# Patient Record
Sex: Male | Born: 1975 | Race: White | Hispanic: No | Marital: Married | Smoking: Never smoker
Health system: Southern US, Community
[De-identification: ages and names within clinical notes are randomized; demographics above are authoritative.]

## PROBLEM LIST (undated history)

## (undated) DIAGNOSIS — I10 Essential (primary) hypertension: Secondary | ICD-10-CM

---

## 1997-09-11 ENCOUNTER — Emergency Department (HOSPITAL_COMMUNITY): Admission: EM | Admit: 1997-09-11 | Discharge: 1997-09-11 | Payer: Self-pay | Admitting: *Deleted

## 1997-12-28 ENCOUNTER — Emergency Department (HOSPITAL_COMMUNITY): Admission: EM | Admit: 1997-12-28 | Discharge: 1997-12-28 | Payer: Self-pay | Admitting: Emergency Medicine

## 1999-02-22 ENCOUNTER — Emergency Department (HOSPITAL_COMMUNITY): Admission: EM | Admit: 1999-02-22 | Discharge: 1999-02-22 | Payer: Self-pay | Admitting: Emergency Medicine

## 1999-12-04 ENCOUNTER — Emergency Department (HOSPITAL_COMMUNITY): Admission: EM | Admit: 1999-12-04 | Discharge: 1999-12-04 | Payer: Self-pay | Admitting: Emergency Medicine

## 2000-06-13 ENCOUNTER — Emergency Department (HOSPITAL_COMMUNITY): Admission: EM | Admit: 2000-06-13 | Discharge: 2000-06-13 | Payer: Self-pay

## 2002-07-05 ENCOUNTER — Emergency Department (HOSPITAL_COMMUNITY): Admission: EM | Admit: 2002-07-05 | Discharge: 2002-07-05 | Payer: Self-pay

## 2002-07-05 ENCOUNTER — Encounter: Payer: Self-pay | Admitting: Emergency Medicine

## 2002-11-05 ENCOUNTER — Emergency Department (HOSPITAL_COMMUNITY): Admission: EM | Admit: 2002-11-05 | Discharge: 2002-11-06 | Payer: Self-pay | Admitting: Emergency Medicine

## 2002-11-06 ENCOUNTER — Encounter: Payer: Self-pay | Admitting: Emergency Medicine

## 2004-05-28 ENCOUNTER — Emergency Department (HOSPITAL_COMMUNITY): Admission: EM | Admit: 2004-05-28 | Discharge: 2004-05-28 | Payer: Self-pay | Admitting: Emergency Medicine

## 2005-03-08 ENCOUNTER — Emergency Department (HOSPITAL_COMMUNITY): Admission: EM | Admit: 2005-03-08 | Discharge: 2005-03-08 | Payer: Self-pay | Admitting: *Deleted

## 2007-02-28 ENCOUNTER — Inpatient Hospital Stay (HOSPITAL_COMMUNITY): Admission: EM | Admit: 2007-02-28 | Discharge: 2007-03-02 | Payer: Self-pay | Admitting: Emergency Medicine

## 2010-06-30 NOTE — H&P (Signed)
Joel Maldonado, Joel Maldonado                 ACCOUNT NO.:  [id number]   MEDICAL RECORD NO.:  [id number]          PATIENT TYPE:  INP   LOCATION:  5118                         FACILITY:  [facility]   PHYSICIAN:  Isidor Holts, M.D.  DATE OF BIRTH:  Nov 30, 1975   DATE OF ADMISSION:  02/28/2007  DATE OF DISCHARGE:                              HISTORY & PHYSICAL   PMD:  Unassigned.   CHIEF COMPLAINT:  Abdominal pain for 1 week on and off, not associated  with fever, vomiting or diarrhea.   HISTORY OF PRESENT ILLNESS:  This is a 35 year old male with above  symptoms, since 02/27/2007 in the evening.  Symptoms have become  increasingly worse, particularly during the course of the night.  The  patient, therefore came to the emergency department this a.m.   PAST MEDICAL HISTORY:  Hypertension, although not on any treatment.   MEDICATIONS:  Not on any regular medication.   ALLERGIES:  NO KNOWN DRUG ALLERGIES.   REVIEW OF SYSTEMS:  As per HPI and chief complaint, otherwise negative.   SOCIAL HISTORY:  The patient is married, works as a Naval architect.  Nonsmoker, drinks alcohol only occasionally, has no history of drug  abuse.  He has one daughter and is expecting yet another offspring.  Both parents still living.  The patient's mother is a breast cancer  survivor.  His father has traumatic paraplegia.  No family history of  heart disease.   PHYSICAL EXAMINATION:  VITALS:  Temperature 101.2, pulse 91 per minute  and regular, respiratory rate 88.  BP initially 160/94 mmHg, rechecked  126/79 mmHg.  Pulse oximeter 99% on room air.  GENERAL:  The patient does not appear in obvious acute distress at the  time of this evaluation, alert, communicative.  Not short of breath at  rest.  HEENT:  No clinical pallor or jaundice.  No conjunctival injection.  Throat is clear.  NECK:  Supple.  JVP not seen.  No palpable lymphadenopathy.  No palpable  goiter.  No carotid bruits.  CHEST:  Clinically clear to  auscultation.  No wheezes or crackles.  HEART:  Sounds 1 and 2 heard.  Normal and regular, no murmurs.  ABDOMEN:  Morbidly obese, soft mildly tender in both lower quadrants,  more on the left than on the right.  No guarding, no rebound.  Normal  bowel sounds.  LOWER EXTREMITY EXAMINATION:  No pitting edema.  Palpable peripheral  pulses.  MUSCULOSKELETAL SYSTEM:  Unremarkable.  CENTRAL NERVOUS SYSTEM:  No focal neurologic deficit on gross  examination.   INVESTIGATIONS:  CBC:  WBC 11, hemoglobin 16, hematocrit 46.5, platelets  218,000.  Electrolytes:  Sodium 135, potassium 3.7, chloride 101, CO2  24, BUN 8, creatinine 0.9, glucose 161, AST 31, ALT 63, alkaline  phosphatase 37.  Urinalysis is negative.  Abdominal CT scan dated  February 28, 2007 showed proximal sigmoid diverticulitis.  No free air,  abscess, or free fluid.  Fatty liver is noted.   ASSESSMENT AND PLAN:  1. Acute diverticulitis.  This clinically appears mild, however the      patient does  have a pyrexia of 101.2.  We shall admit him,      institute bowel rest, commence intravenous fluid hydration,      analgesics and intravenous Ciprofloxacin and Flagyl.  For      completeness, will do blood cultures and serum lipase levels.   1. Hypertension.  The patient has a known history of hypertension and      is not on any medication at the present time.  Although, he had a      bump in blood pressure at the time of presentation, this may be      likely secondary to pain, or possibly anxiety.  Right now he is      normotensive.  We shall however, observe closely and should blood      pressure become persistently elevated, we will address as      appropriate.   1. Morbid obesity.  We shall check TSH, lipid profile and HbA1c.   1. Fatty liver.  The patient would certainly benefit from weight loss.   Further management will depend on clinical course.      Isidor Holts, M.D.  Electronically Signed     CO/MEDQ  D:   02/28/2007  T:  02/28/2007  Job:  [id number]

## 2010-06-30 NOTE — Discharge Summary (Signed)
Joel Maldonado, Joel Maldonado                 ACCOUNT NO.:  [id number]   MEDICAL RECORD NO.:  [id number]          PATIENT TYPE:  INP   LOCATION:  5118                         FACILITY:  [facility]   PHYSICIAN:  Elliot Cousin, M.D.    DATE OF BIRTH:  12/30/1975   DATE OF ADMISSION:  02/28/2007  DATE OF DISCHARGE:  03/02/2007                               DISCHARGE SUMMARY   DISCHARGE DIAGNOSES:  1. Acute proximal sigmoid diverticulitis.  2. Fatty liver.  3. Obesity.  4. Hyperlipidemia.   DISCHARGE MEDICATIONS:  1. Cipro 500 mg b.i.d. for 7 more days.  2. Flagyl 500 mg t.i.d. for 7 more days.  3. Lipitor 20 mg half a tablet daily.   DISCHARGE DISPOSITION:  The patient was discharged home in improved and  stable condition on March 02, 2007.  He was advised to follow up with  the [facility] in 1 week.   PROCEDURE PERFORMED:  CT scan of the abdomen and pelvis with contrast on  February 28, 2007.  The results revealed proximal sigmoid diverticulitis  without abscess or gross free air.   CONSULTATIONS:  None.   HISTORY OF PRESENT ILLNESS:  The patient is a 35 year old man who  presented to the emergency department on February 28, 2007 with a chief  complaint of abdominal pain and nausea.  When he presented to the  emergency department, he was febrile with a temperature of 101.2.  A CT  scan of the abdomen and pelvis was ordered in the emergency department,  and the results were significant for proximal sigmoid diverticulitis.  The patient was therefore admitted for further evaluation and  management.   HOSPITAL COURSE:  1. ACUTE SIGMOID DIVERTICULITIS.  Blood cultures were ordered on      admission.  The patient was started on intravenous Flagyl and      ciprofloxacin.  He was started on a clear liquid diet.  Pain      management was started with as-needed Dilaudid and oxycodone.      Maintenance IV fluids were started with normal saline at 100 mL an      hour.  Over the course of the  hospitalization, the patient's pain      subsided and then completely resolved.  His blood cultures have      remained negative so far during the hospitalization.  His WBC,      which was 11.0 at the time of the initial evaluation, improved to      6.9 prior to hospital discharge.  The patient has been completely      afebrile for 24 hours.  He will be discharged home today on 7 more      days of Cipro and Flagyl.  2. HYPERLIPIDEMIA.  The patient's fasting lipid panel was assessed and      the results are as follows: Total      cholesterol 204, triglycerides 138, HDL 37, and LDL 39.  The      patient will be discharged to home on Lipitor 10 mg daily.  He was  advised to follow a low-fat diet and to lose weight.   DISCHARGE LABORATORY DATA:  TSH 4.3.  Hemoglobin A1c 6.0.  Blood  cultures negative so far.      Elliot Cousin, M.D.  Electronically Signed     DF/MEDQ  D:  03/02/2007  T:  03/02/2007  Job:  [id number]   cc:   Melvern Banker

## 2010-11-05 LAB — URINALYSIS, ROUTINE W REFLEX MICROSCOPIC
Bilirubin Urine: NEGATIVE
Hgb urine dipstick: NEGATIVE
Specific Gravity, Urine: 1.022
pH: 6

## 2010-11-05 LAB — LIPID PANEL
HDL: 37 — ABNORMAL LOW
LDL Cholesterol: 139 — ABNORMAL HIGH
Total CHOL/HDL Ratio: 5.5
Triglycerides: 138
VLDL: 28

## 2010-11-05 LAB — CULTURE, BLOOD (ROUTINE X 2)
Culture: NO GROWTH
Culture: NO GROWTH

## 2010-11-05 LAB — BASIC METABOLIC PANEL
Calcium: 9.4
GFR calc Af Amer: >60
GFR calc non Af Amer: >60
Potassium: 4.3
Sodium: 137

## 2010-11-05 LAB — DIFFERENTIAL
Basophils Absolute: 0
Basophils Relative: 0
Eosinophils Absolute: 0.1
Monocytes Relative: 9
Neutro Abs: 8.5 — ABNORMAL HIGH
Neutrophils Relative %: 77

## 2010-11-05 LAB — COMPREHENSIVE METABOLIC PANEL
ALT: 63 — ABNORMAL HIGH
Alkaline Phosphatase: 37 — ABNORMAL LOW
BUN: 8
CO2: 24
Chloride: 101
Glucose, Bld: 161 — ABNORMAL HIGH
Potassium: 3.7
Sodium: 135
Total Bilirubin: 1.1
Total Protein: 7.1

## 2010-11-05 LAB — I-STAT 8, (EC8 V) (CONVERTED LAB)
BUN: 9
Bicarbonate: 22.3
Chloride: 107
Operator id: 282201
pCO2, Ven: 29 — ABNORMAL LOW
pH, Ven: 7.494 — ABNORMAL HIGH

## 2010-11-05 LAB — CBC
HCT: 46.5
Hemoglobin: 14.3
Hemoglobin: 16
RBC: 4.64
RBC: 5.19
RDW: 12.9
WBC: 11 — ABNORMAL HIGH
WBC: 6.9

## 2010-11-05 LAB — POCT CARDIAC MARKERS
CKMB, poc: 1.5
Myoglobin, poc: 89.8

## 2010-11-05 LAB — POCT I-STAT CREATININE: Creatinine, Ser: 1.1

## 2010-11-22 ENCOUNTER — Emergency Department (HOSPITAL_COMMUNITY)
Admission: EM | Admit: 2010-11-22 | Discharge: 2010-11-23 | Disposition: A | Discharge status: Home / Self Care | Payer: Self-pay | Source: Home / Self Care | Attending: Emergency Medicine | Admitting: Emergency Medicine

## 2010-11-22 DIAGNOSIS — R109 Unspecified abdominal pain: Secondary | ICD-10-CM | POA: Insufficient documentation

## 2010-11-22 DIAGNOSIS — R197 Diarrhea, unspecified: Secondary | ICD-10-CM | POA: Insufficient documentation

## 2010-11-22 DIAGNOSIS — R112 Nausea with vomiting, unspecified: Secondary | ICD-10-CM | POA: Insufficient documentation

## 2010-11-22 DIAGNOSIS — I10 Essential (primary) hypertension: Secondary | ICD-10-CM | POA: Insufficient documentation

## 2010-11-23 ENCOUNTER — Emergency Department (HOSPITAL_COMMUNITY)
Admit: 2010-11-23 | Discharge: 2010-11-23 | Disposition: A | Discharge status: Home / Self Care | Payer: Self-pay | Attending: Emergency Medicine | Admitting: Emergency Medicine

## 2010-11-23 LAB — HEPATIC FUNCTION PANEL
Bilirubin, Direct: 0.1 mg/dL (ref 0.0–0.3)
Total Bilirubin: 1 mg/dL (ref 0.3–1.2)

## 2010-11-23 LAB — URINALYSIS, ROUTINE W REFLEX MICROSCOPIC
Bilirubin Urine: NEGATIVE
Ketones, ur: NEGATIVE mg/dL
Nitrite: NEGATIVE
Protein, ur: NEGATIVE mg/dL
pH: 5.5 (ref 5.0–8.0)

## 2010-11-23 LAB — CBC
HCT: 46.9 % (ref 39.0–52.0)
MCHC: 36.2 g/dL — ABNORMAL HIGH (ref 30.0–36.0)
MCV: 87.7 fL (ref 78.0–100.0)
RDW: 12.6 % (ref 11.5–15.5)

## 2010-11-23 LAB — DIFFERENTIAL
Basophils Absolute: 0 10*3/uL (ref 0.0–0.1)
Eosinophils Relative: 2 % (ref 0–5)
Lymphocytes Relative: 21 % (ref 12–46)
Monocytes Absolute: 0.8 10*3/uL (ref 0.1–1.0)

## 2010-11-23 LAB — POCT I-STAT, CHEM 8
Creatinine, Ser: 1.1 mg/dL (ref 0.50–1.35)
Glucose, Bld: 127 mg/dL — ABNORMAL HIGH (ref 70–99)
Hemoglobin: 17.3 g/dL — ABNORMAL HIGH (ref 13.0–17.0)
TCO2: 25 mmol/L (ref 0–100)

## 2010-11-23 LAB — LIPASE, BLOOD: Lipase: 33 U/L (ref 11–59)

## 2010-11-23 MED ORDER — IOHEXOL 300 MG/ML  SOLN
100.0000 mL | Freq: Once | INTRAMUSCULAR | Status: AC | PRN
  Administered 2010-11-23: 100 mL via INTRAVENOUS

## 2012-08-27 ENCOUNTER — Emergency Department (HOSPITAL_BASED_OUTPATIENT_CLINIC_OR_DEPARTMENT_OTHER): Payer: Self-pay

## 2012-08-27 ENCOUNTER — Emergency Department (HOSPITAL_BASED_OUTPATIENT_CLINIC_OR_DEPARTMENT_OTHER)
Admission: EM | Admit: 2012-08-27 | Discharge: 2012-08-27 | Disposition: A | Discharge status: Home / Self Care | Payer: Self-pay | Attending: Emergency Medicine | Admitting: Emergency Medicine

## 2012-08-27 ENCOUNTER — Encounter (HOSPITAL_BASED_OUTPATIENT_CLINIC_OR_DEPARTMENT_OTHER): Payer: Self-pay | Admitting: Emergency Medicine

## 2012-08-27 DIAGNOSIS — K5732 Diverticulitis of large intestine without perforation or abscess without bleeding: Secondary | ICD-10-CM | POA: Insufficient documentation

## 2012-08-27 DIAGNOSIS — K5792 Diverticulitis of intestine, part unspecified, without perforation or abscess without bleeding: Secondary | ICD-10-CM

## 2012-08-27 DIAGNOSIS — R739 Hyperglycemia, unspecified: Secondary | ICD-10-CM

## 2012-08-27 DIAGNOSIS — R509 Fever, unspecified: Secondary | ICD-10-CM | POA: Insufficient documentation

## 2012-08-27 DIAGNOSIS — R7309 Other abnormal glucose: Secondary | ICD-10-CM | POA: Insufficient documentation

## 2012-08-27 LAB — COMPREHENSIVE METABOLIC PANEL
ALT: 63 U/L — ABNORMAL HIGH (ref 0–53)
AST: 26 U/L (ref 0–37)
Albumin: 3.7 g/dL (ref 3.5–5.2)
CO2: 27 mEq/L (ref 19–32)
Calcium: 10.1 mg/dL (ref 8.4–10.5)
Chloride: 101 mEq/L (ref 96–112)
GFR calc non Af Amer: >90 mL/min (ref >90)
Sodium: 139 mEq/L (ref 135–145)

## 2012-08-27 LAB — CG4 I-STAT (LACTIC ACID): Lactic Acid, Venous: 1.08 mmol/L (ref 0.5–2.2)

## 2012-08-27 LAB — URINALYSIS, ROUTINE W REFLEX MICROSCOPIC
Bilirubin Urine: NEGATIVE
Glucose, UA: >1000 mg/dL — AB
Hgb urine dipstick: NEGATIVE
Leukocytes, UA: NEGATIVE
Nitrite: NEGATIVE
Specific Gravity, Urine: 1.027 (ref 1.005–1.030)

## 2012-08-27 LAB — POCT I-STAT 3, VENOUS BLOOD GAS (G3P V)
Acid-Base Excess: 4 mmol/L — ABNORMAL HIGH (ref 0.0–2.0)
Bicarbonate: 27.7 mEq/L — ABNORMAL HIGH (ref 20.0–24.0)
Patient temperature: 98.8
pH, Ven: 7.47 — ABNORMAL HIGH (ref 7.250–7.300)

## 2012-08-27 LAB — CBC WITH DIFFERENTIAL/PLATELET
Basophils Absolute: 0 10*3/uL (ref 0.0–0.1)
Eosinophils Relative: 1 % (ref 0–5)
Hemoglobin: 15.4 g/dL (ref 13.0–17.0)
Lymphocytes Relative: 19 % (ref 12–46)
Neutro Abs: 6.1 10*3/uL (ref 1.7–7.7)
Platelets: 178 10*3/uL (ref 150–400)
RDW: 12.8 % (ref 11.5–15.5)
WBC: 9 10*3/uL (ref 4.0–10.5)

## 2012-08-27 LAB — GLUCOSE, CAPILLARY: Glucose-Capillary: 146 mg/dL — ABNORMAL HIGH (ref 70–99)

## 2012-08-27 LAB — KETONES, QUALITATIVE: Acetone, Bld: NEGATIVE

## 2012-08-27 LAB — URINE MICROSCOPIC-ADD ON

## 2012-08-27 MED ORDER — CIPROFLOXACIN IN D5W 400 MG/200ML IV SOLN
400.0000 mg | Freq: Once | INTRAVENOUS | Status: AC
  Administered 2012-08-27: 400 mg via INTRAVENOUS
  Filled 2012-08-27: qty 200

## 2012-08-27 MED ORDER — METRONIDAZOLE 500 MG PO TABS: 500.0000 mg | ORAL_TABLET | Freq: Two times a day (BID) | ORAL | Status: DC

## 2012-08-27 MED ORDER — MORPHINE SULFATE 4 MG/ML IJ SOLN
4.0000 mg | Freq: Once | INTRAMUSCULAR | Status: AC
  Administered 2012-08-27: 4 mg via INTRAVENOUS
  Filled 2012-08-27 (×2): qty 1

## 2012-08-27 MED ORDER — IOHEXOL 300 MG/ML  SOLN
50.0000 mL | Freq: Once | INTRAMUSCULAR | Status: AC | PRN
  Administered 2012-08-27: 50 mL via ORAL

## 2012-08-27 MED ORDER — CIPROFLOXACIN HCL 500 MG PO TABS: 500.0000 mg | ORAL_TABLET | Freq: Two times a day (BID) | ORAL | Status: DC

## 2012-08-27 MED ORDER — ONDANSETRON HCL 4 MG PO TABS: 4.0000 mg | ORAL_TABLET | Freq: Four times a day (QID) | ORAL | Status: DC

## 2012-08-27 MED ORDER — IOHEXOL 300 MG/ML  SOLN
100.0000 mL | Freq: Once | INTRAMUSCULAR | Status: AC | PRN
  Administered 2012-08-27: 100 mL via INTRAVENOUS

## 2012-08-27 MED ORDER — ONDANSETRON HCL 4 MG/2ML IJ SOLN
4.0000 mg | Freq: Once | INTRAMUSCULAR | Status: AC
  Administered 2012-08-27: 4 mg via INTRAVENOUS
  Filled 2012-08-27 (×2): qty 2

## 2012-08-27 MED ORDER — METRONIDAZOLE IN NACL 5-0.79 MG/ML-% IV SOLN
500.0000 mg | Freq: Once | INTRAVENOUS | Status: AC
  Administered 2012-08-27: 500 mg via INTRAVENOUS
  Filled 2012-08-27: qty 100

## 2012-08-27 MED ORDER — SODIUM CHLORIDE 0.9 % IV BOLUS (SEPSIS)
1000.0000 mL | Freq: Once | INTRAVENOUS | Status: AC
  Administered 2012-08-27: 1000 mL via INTRAVENOUS

## 2012-08-27 MED ORDER — HYDROCODONE-ACETAMINOPHEN 5-325 MG PO TABS: 2.0000 | ORAL_TABLET | ORAL | Status: DC | PRN

## 2012-08-27 NOTE — ED Provider Notes (Signed)
History  This chart was scribed for Joel Octave, MD, by Candelaria Stagers, ED Scribe. This patient was seen in room MH09/MH09 and the patient's care was started at 3:13 PM  CSN: [id number] Arrival date & time 08/27/12  1404  First MD Initiated Contact with Patient 08/27/12 1458     Chief Complaint  Patient presents with  . Abdominal Pain    The history is provided by the patient. No language interpreter was used.   HPI Comments: Joel Maldonado is a 37 y.o. male who presents to the Emergency Department complaining of waxing and waning lower abdominal pain that started two days ago.  He reports the pain last for several seconds each time occuring several times throughout the day.  Pt reports he has experienced similar pain about 5 years ago associated with h/o diverticulitis.  He was hospitalized during this episode.  Pt denies diarrhea, vomiting, dysuria, back pain, testicular pain, hematuria, or blood in stool.  Pt reports fever of 99.5 last night.  Nothing seems to make the sx better or worse.      No PCP  History reviewed. No pertinent past medical history. History reviewed. No pertinent past surgical history. No family history on file. History  Substance Use Topics  . Smoking status: Never Smoker   . Smokeless tobacco: Current User  . Alcohol Use: Yes     Comment: occasionally    Review of Systems  Constitutional: Positive for fever.  Gastrointestinal: Positive for abdominal pain (lower abdominal pain).  All other systems reviewed and are negative.    Allergies  Review of patient's allergies indicates no known allergies.  Home Medications  No current outpatient prescriptions on file. BP 168/117  Pulse 96  Temp(Src) 98.8 F (37.1 C) (Oral)  Resp 18  Ht 5\' 8"  (1.727 m)  Wt 285 lb (129.275 kg)  BMI 43.34 kg/m2  SpO2 100% Physical Exam  Nursing note and vitals reviewed. Constitutional: He is oriented to person, place, and time. He appears well-developed and  well-nourished. No distress.  HENT:  Head: Normocephalic and atraumatic.  Eyes: EOM are normal.  Neck: Neck supple. No tracheal deviation present.  Cardiovascular: Normal rate.   Pulmonary/Chest: Effort normal. No respiratory distress.  Abdominal: There is tenderness (mild lower abdominal tenderness). There is no rebound and no guarding.  Genitourinary:  No testicular tenderness.   Musculoskeletal: Normal range of motion.  No CVA tenderness.   Neurological: He is alert and oriented to person, place, and time.  Skin: Skin is warm and dry.  Psychiatric: He has a normal mood and affect. His behavior is normal.    ED Course  Procedures   DIAGNOSTIC STUDIES: Oxygen Saturation is 100% on room air, normal by my interpretation.    COORDINATION OF CARE:  3:19 PM Discussed course of care with pt which includes fluids, zofran, and CT abdomen.  Pt denies pain medication.  Pt understands and agrees.   5:35 PM Discussed images and lab results with pt.  Advised pt to follow up with PCP for elevated blood sugar.  Will prescribe antibiotics.  Pt understands and agrees.   Labs Reviewed  CBC WITH DIFFERENTIAL - Abnormal; Notable for the following:    Monocytes Absolute 1.1 (*)    All other components within normal limits  COMPREHENSIVE METABOLIC PANEL - Abnormal; Notable for the following:    Glucose, Bld 148 (*)    ALT 63 (*)    Total Bilirubin 1.3 (*)    All other components  within normal limits  URINALYSIS, ROUTINE W REFLEX MICROSCOPIC - Abnormal; Notable for the following:    Glucose, UA >1000 (*)    All other components within normal limits  GLUCOSE, CAPILLARY - Abnormal; Notable for the following:    Glucose-Capillary 146 (*)    All other components within normal limits  POCT I-STAT 3, BLOOD GAS (G3P V) - Abnormal; Notable for the following:    pH, Ven 7.470 (*)    pCO2, Ven 38.1 (*)    pO2, Ven 46.0 (*)    Bicarbonate 27.7 (*)    Acid-Base Excess 4.0 (*)    All other components  within normal limits  URINE MICROSCOPIC-ADD ON  LIPASE, BLOOD  KETONES, QUALITATIVE  CG4 I-STAT (LACTIC ACID)   Ct Abdomen Pelvis W Contrast  08/27/2012   *RADIOLOGY REPORT*  Clinical Data: Mid and lower abdominal pain for 3 days.  CT ABDOMEN AND PELVIS WITH CONTRAST  Technique:  Multidetector CT imaging of the abdomen and pelvis was performed following the standard protocol during bolus administration of intravenous contrast.  Contrast: 50mL OMNIPAQUE IOHEXOL 300 MG/ML  SOLN, OMNIPAQUE IOHEXOL 300 MG/ML  SOLN  Comparison: CT abdomen and pelvis 11/23/2010.  Findings: Lung bases are clear.  There is no pleural or pericardial effusion.  The liver is diffusely low attenuating consistent with fatty infiltration.  No focal liver lesion.  The gallbladder, biliary tree, adrenal glands, spleen, pancreas and right kidney appear normal.  There is a punctate nonobstructing stone in the mid pole of the left kidney.  The patient has sigmoid diverticular disease and there is wall thickening of the proximal sigmoid colon with surrounding inflammatory change consistent with superimposed acute diverticulitis.  No abscess or perforation is identified.  The colon is otherwise unremarkable.  The stomach and small bowel appear normal.  No lymphadenopathy or fluid is seen.  No focal bony abnormality is identified.  IMPRESSION:  1.  Study is positive for sigmoid diverticulosis with superimposed acute diverticulitis.  Negative for abscess or perforation. 2.  Fatty infiltration of the liver. 3.  Punctate nonobstructing stone upper pole left kidney.   Original Report Authenticated By: Holley Dexter, M.D.   No diagnosis found.  MDM  Lower abdominal pain similar to previous diverticulitis.  No fever, vomiting, diarrhea.  Glucose in urine with serum glucose of 148. No evidence of DKA.  Uncomplicated diverticulitis on CT scan. Patient's pain is under control. Tolerating by mouth. He is stable for outpatient antibiotics.  He is informed to followup with primary care physician regarding elevated blood sugar.  I personally performed the services described in this documentation, which was scribed in my presence. The recorded information has been reviewed and is accurate.    Joel Octave, MD 08/27/12 [phone]

## 2012-08-27 NOTE — ED Notes (Signed)
Pt discharged home with spouse.  Pt verablizes understanding of instructions.

## 2012-08-27 NOTE — ED Notes (Signed)
Pt. C/o lower middle abd pain since Fri intermittently; hx of diverticulitis and pain is similar;  Denies NVD

## 2013-04-23 ENCOUNTER — Encounter (HOSPITAL_COMMUNITY): Payer: Self-pay | Admitting: Emergency Medicine

## 2013-04-23 ENCOUNTER — Emergency Department (HOSPITAL_COMMUNITY)
Admission: EM | Admit: 2013-04-23 | Discharge: 2013-04-24 | Disposition: A | Discharge status: Home / Self Care | Payer: Self-pay | Attending: Emergency Medicine | Admitting: Emergency Medicine

## 2013-04-23 ENCOUNTER — Emergency Department (HOSPITAL_COMMUNITY): Payer: Self-pay

## 2013-04-23 DIAGNOSIS — S93601A Unspecified sprain of right foot, initial encounter: Secondary | ICD-10-CM

## 2013-04-23 DIAGNOSIS — Y9367 Activity, basketball: Secondary | ICD-10-CM | POA: Insufficient documentation

## 2013-04-23 DIAGNOSIS — X500XXA Overexertion from strenuous movement or load, initial encounter: Secondary | ICD-10-CM | POA: Insufficient documentation

## 2013-04-23 DIAGNOSIS — Y9239 Other specified sports and athletic area as the place of occurrence of the external cause: Secondary | ICD-10-CM | POA: Insufficient documentation

## 2013-04-23 DIAGNOSIS — Y92838 Other recreation area as the place of occurrence of the external cause: Secondary | ICD-10-CM

## 2013-04-23 DIAGNOSIS — S93409A Sprain of unspecified ligament of unspecified ankle, initial encounter: Secondary | ICD-10-CM | POA: Insufficient documentation

## 2013-04-23 NOTE — ED Notes (Signed)
Patient states playing basketball and when up to get the ball and his foot landed on another players foot rolling his right foot over

## 2013-04-23 NOTE — ED Provider Notes (Signed)
CSN: [id number]     Arrival date & time 04/23/13  2224 History   None    This chart was scribed for non-physician practitioner, Antony MaduraKelly Laporche Martelle, PA-C working with Loren Raceravid Yelverton, MD by Arlan OrganAshley Leger, ED Scribe. This patient was seen in room TR09C/TR09C and the patient's care was started at 12:00 AM.   Chief Complaint  Patient presents with  . Foot Injury   The history is provided by the patient. No language interpreter was used.    HPI Comments: Joel Maldonado is a 38 y.o. male who presents to the Emergency Department complaining of gradually worsening, constant pain to the right ankle that occurred a couple hours prior to arrival. Pt states he was playing basketball, and twisted his ankle resulting in injury. Pt also reports associated swelling to the ankle, and describes his current pain as throbbing. He has not tried anything OTC or any home remedies for his discomfort. Denies any recent or previous injury to the ankle. At this time he denies any fever, weakness, or numbness. Pt has no pertinent medical history, and no other concerns this visit.   History reviewed. No pertinent past medical history. History reviewed. No pertinent past surgical history. History reviewed. No pertinent family history. History  Substance Use Topics  . Smoking status: Never Smoker   . Smokeless tobacco: Current User  . Alcohol Use: Yes     Comment: occasionally    Review of Systems  Constitutional: Negative for fever and chills.  Musculoskeletal: Positive for arthralgias.  Skin: Negative for rash.  Neurological: Negative for weakness and numbness.     Allergies  Review of patient's allergies indicates no known allergies.  Home Medications   Current Outpatient Rx  Name  Route  Sig  Dispense  Refill  . ibuprofen (ADVIL,MOTRIN) 600 MG tablet   Oral   Take 1 tablet (600 mg total) by mouth every 6 (six) hours as needed.   30 tablet   0   . traMADol (ULTRAM) 50 MG tablet   Oral   Take 1 tablet (50  mg total) by mouth every 6 (six) hours as needed.   15 tablet   0    Triage Vitals: BP 166/103  Pulse 97  Temp(Src) 98.7 F (37.1 C) (Oral)  Resp 18  Ht 5\' 8"  (1.727 m)  Wt 287 lb (130.182 kg)  BMI 43.65 kg/m2  SpO2 96%   Physical Exam  Nursing note and vitals reviewed. Constitutional: He is oriented to person, place, and time. He appears well-developed and well-nourished. No distress.  HENT:  Head: Normocephalic and atraumatic.  Eyes: Conjunctivae and EOM are normal. No scleral icterus.  Neck: Normal range of motion.  Cardiovascular: Normal rate, regular rhythm and intact distal pulses.   DP and PT pulses 2+ b/l. Capillary refill normal in all digits of R foot.  Pulmonary/Chest: Effort normal. No respiratory distress.  Musculoskeletal: Normal range of motion.       Right ankle: Normal.       Right foot: He exhibits tenderness and swelling (mild). He exhibits normal range of motion, no bony tenderness, normal capillary refill, no crepitus, no deformity and no laceration.       Feet:  Neurological: He is alert and oriented to person, place, and time. He has normal strength and normal reflexes. He displays normal reflexes. No sensory deficit. GCS eye subscore is 4. GCS verbal subscore is 5. GCS motor subscore is 6.  Reflex Scores:      Patellar reflexes  are 2+ on the right side and 2+ on the left side.      Achilles reflexes are 2+ on the right side and 2+ on the left side. Skin: Skin is warm and dry. No rash noted. He is not diaphoretic. No erythema. No pallor.  Psychiatric: He has a normal mood and affect. His behavior is normal.    ED Course  Procedures (including critical care time)  DIAGNOSTIC STUDIES: Oxygen Saturation is 96% on RA, adequate by my interpretation.    COORDINATION OF CARE: 12:03 AM- Will order X-Ray. Discussed treatment plan with pt at bedside and pt agreed to plan.     Labs Review Labs Reviewed - No data to display Imaging Review Dg Foot Complete  Right  04/23/2013   CLINICAL DATA:  Foot injury while playing basketball.  EXAM: RIGHT FOOT COMPLETE - 3+ VIEW  COMPARISON:  None available for comparison at time of study interpretation.  FINDINGS: There is no evidence of fracture or dislocation. There is no evidence of arthropathy or other focal bone abnormality. Small calcaneal spur plantar fascia insertion. Tiny corticated bony fragment at medial malleolus tip suggests remote avulsion injury though, recommend correlation with point tenderness. Mild midfoot osteoarthrosis. Soft tissues are unremarkable.  IMPRESSION: No acute foot fracture deformity or dislocation.   Electronically Signed   By: Awilda Metro   On: 04/23/2013 23:51     EKG Interpretation None      MDM   Final diagnoses:  Sprain of foot, right    Uncomplicated right foot sprain. Patient well and nontoxic appearing, hemodynamically stable, and afebrile. He is neurovascularly intact on physical exam. No gross sensory deficits appreciated. Reflexes normal and symmetric in bilateral lower extremities. X-ray today is negative for fracture, dislocation, or bony deformity. Symptoms consistent with sprain of dorsolateral aspect of right foot. ASO ankle applied in ED for stability. RICE advised as well as ibuprofen for pain control. Return precautions discussed and patient agreeable to plan with no unaddressed concerns.  I personally performed the services described in this documentation, which was scribed in my presence. The recorded information has been reviewed and is accurate.    Antony Madura, PA-C 04/24/13 [phone]

## 2013-04-24 MED ORDER — IBUPROFEN 600 MG PO TABS: 600.0000 mg | ORAL_TABLET | Freq: Four times a day (QID) | ORAL | Status: DC | PRN

## 2013-04-24 MED ORDER — TRAMADOL HCL 50 MG PO TABS: 50.0000 mg | ORAL_TABLET | Freq: Four times a day (QID) | ORAL | Status: DC | PRN

## 2013-04-24 NOTE — ED Provider Notes (Signed)
Medical screening examination/treatment/procedure(s) were performed by non-physician practitioner and as supervising physician I was immediately available for consultation/collaboration.   EKG Interpretation None        Jamesia Linnen, MD 04/24/13 2321 

## 2013-04-24 NOTE — Discharge Instructions (Signed)
Recommend rest, ice the affected area 3-4 times per day for at least 30 minutes each time, compression with an ankle brace or Ace wrap, and elevation. Take ibuprofen 600 mg every 6 hours for pain control. You may take tramadol as prescribed for breakthrough pain. Do not drive or drink alcohol after taking this medication as it may make you drowsy or impaired judgment. Return if symptoms worsen.  Foot Sprain The muscles and cord like structures which attach muscle to bone (tendons) that surround the feet are made up of units. A foot sprain can occur at the weakest spot in any of these units. This condition is most often caused by injury to or overuse of the foot, as from playing contact sports, or aggravating a previous injury, or from poor conditioning, or obesity. SYMPTOMS  Pain with movement of the foot.  Tenderness and swelling at the injury site.  Loss of strength is present in moderate or severe sprains. THE THREE GRADES OR SEVERITY OF FOOT SPRAIN ARE:  Mild (Grade I): Slightly pulled muscle without tearing of muscle or tendon fibers or loss of strength.  Moderate (Grade II): Tearing of fibers in a muscle, tendon, or at the attachment to bone, with small decrease in strength.  Severe (Grade III): Rupture of the muscle-tendon-bone attachment, with separation of fibers. Severe sprain requires surgical repair. Often repeating (chronic) sprains are caused by overuse. Sudden (acute) sprains are caused by direct injury or over-use. DIAGNOSIS  Diagnosis of this condition is usually by your own observation. If problems continue, a caregiver may be required for further evaluation and treatment. X-rays may be required to make sure there are not breaks in the bones (fractures) present. Continued problems may require physical therapy for treatment. PREVENTION  Use strength and conditioning exercises appropriate for your sport.  Warm up properly prior to working out.  Use athletic shoes that are  made for the sport you are participating in.  Allow adequate time for healing. Early return to activities makes repeat injury more likely, and can lead to an unstable arthritic foot that can result in prolonged disability. Mild sprains generally heal in 3 to 10 days, with moderate and severe sprains taking 2 to 10 weeks. Your caregiver can help you determine the proper time required for healing. HOME CARE INSTRUCTIONS   Apply ice to the injury for 15-20 minutes, 03-04 times per day. Put the ice in a plastic bag and place a towel between the bag of ice and your skin.  An elastic wrap (like an Ace bandage) may be used to keep swelling down.  Keep foot above the level of the heart, or at least raised on a footstool, when swelling and pain are present.  Try to avoid use other than gentle range of motion while the foot is painful. Do not resume use until instructed by your caregiver. Then begin use gradually, not increasing use to the point of pain. If pain does develop, decrease use and continue the above measures, gradually increasing activities that do not cause discomfort, until you gradually achieve normal use.  Use crutches if and as instructed, and for the length of time instructed.  Keep injured foot and ankle wrapped between treatments.  Massage foot and ankle for comfort and to keep swelling down. Massage from the toes up towards the knee.  Only take over-the-counter or prescription medicines for pain, discomfort, or fever as directed by your caregiver. SEEK IMMEDIATE MEDICAL CARE IF:   Your pain and swelling increase, or  pain is not controlled with medications.  You have loss of feeling in your foot or your foot turns cold or blue.  You develop new, unexplained symptoms, or an increase of the symptoms that brought you to your caregiver. MAKE SURE YOU:   Understand these instructions.  Will watch your condition.  Will get help right away if you are not doing well or get  worse. Document Released: 07/24/2001 Document Revised: 04/26/2011 Document Reviewed: 09/21/2007 [facility] Patient Information 2014 [place], [place].  Ligament Sprain A ligament sprain is when the bands of tissue that hold bones together (ligament) are stretched. HOME CARE   Rest the injured area.  Start using the joint when told to by your doctor.  Keep the injured area raised (elevated) above the level of the heart. This may lessen puffiness (swelling).  Put ice on the injured area.  Put ice in a plastic bag.  Place a towel between your skin and the bag.  Leave the ice on for 15-20 minutes, 03-04 times a day.  Wear a splint, cast, or an elastic bandage as told by your doctor.  Only take medicine as told by your doctor.  Use crutches as told by your doctor. Do not put weight on the injured joint until told to by your doctor. GET HELP RIGHT AWAY IF:   You have more bruising, puffiness, or pain.  The leg was injured and the toes are cold, tingling, numb, or blue.  The arm was injured and the fingers are cold, tingling, numb, or blue.  The pain is not helped with medicine.  The pain gets worse. MAKE SURE YOU:   Understand these instructions.  Will watch this condition.  Will get help right away if you are not doing well or get worse. Document Released: 07/21/2007 Document Revised: 11/22/2012 Document Reviewed: 07/21/2007 [facility] Patient Information 2014 [place], [place]. RICE: Routine Care for Injuries The routine care of many injuries includes Rest, Ice, Compression, and Elevation (RICE). HOME CARE INSTRUCTIONS  Rest is needed to allow your body to heal. Routine activities can usually be resumed when comfortable. Injured tendons and bones can take up to 6 weeks to heal. Tendons are the cord-like structures that attach muscle to bone.  Ice following an injury helps keep the swelling down and reduces pain.  Put ice in a plastic bag.  Place a towel between your skin  and the bag.  Leave the ice on for 15-20 minutes, 03-04 times a day. Do this while awake, for the first 24 to 48 hours. After that, continue as directed by your caregiver.  Compression helps keep swelling down. It also gives support and helps with discomfort. If an elastic bandage has been applied, it should be removed and reapplied every 3 to 4 hours. It should not be applied tightly, but firmly enough to keep swelling down. Watch fingers or toes for swelling, bluish discoloration, coldness, numbness, or excessive pain. If any of these problems occur, remove the bandage and reapply loosely. Contact your caregiver if these problems continue.  Elevation helps reduce swelling and decreases pain. With extremities, such as the arms, hands, legs, and feet, the injured area should be placed near or above the level of the heart, if possible. SEEK IMMEDIATE MEDICAL CARE IF:  You have persistent pain and swelling.  You develop redness, numbness, or unexpected weakness.  Your symptoms are getting worse rather than improving after several days. These symptoms may indicate that further evaluation or further X-rays are needed. Sometimes, X-rays may  not show a small broken bone (fracture) until 1 week or 10 days later. Make a follow-up appointment with your caregiver. Ask when your X-ray results will be ready. Make sure you get your X-ray results. Document Released: 05/16/2000 Document Revised: 04/26/2011 Document Reviewed: 07/03/2010 [facility] Patient Information 2014 [place], [place].

## 2014-01-06 ENCOUNTER — Emergency Department (HOSPITAL_BASED_OUTPATIENT_CLINIC_OR_DEPARTMENT_OTHER): Payer: Self-pay

## 2014-01-06 ENCOUNTER — Emergency Department (HOSPITAL_BASED_OUTPATIENT_CLINIC_OR_DEPARTMENT_OTHER)
Admission: EM | Admit: 2014-01-06 | Discharge: 2014-01-06 | Disposition: A | Discharge status: Home / Self Care | Payer: Self-pay | Attending: Emergency Medicine | Admitting: Emergency Medicine

## 2014-01-06 ENCOUNTER — Encounter (HOSPITAL_BASED_OUTPATIENT_CLINIC_OR_DEPARTMENT_OTHER): Payer: Self-pay | Admitting: *Deleted

## 2014-01-06 DIAGNOSIS — R0789 Other chest pain: Secondary | ICD-10-CM | POA: Insufficient documentation

## 2014-01-06 DIAGNOSIS — Z8679 Personal history of other diseases of the circulatory system: Secondary | ICD-10-CM | POA: Insufficient documentation

## 2014-01-06 DIAGNOSIS — R079 Chest pain, unspecified: Secondary | ICD-10-CM

## 2014-01-06 DIAGNOSIS — Z79899 Other long term (current) drug therapy: Secondary | ICD-10-CM | POA: Insufficient documentation

## 2014-01-06 LAB — TROPONIN I: Troponin I: <0.3 ng/mL (ref <0.30)

## 2014-01-06 LAB — D-DIMER, QUANTITATIVE (NOT AT ARMC)

## 2014-01-06 MED ORDER — OMEPRAZOLE 20 MG PO CPDR: 20.0000 mg | DELAYED_RELEASE_CAPSULE | Freq: Every day | ORAL | Status: AC

## 2014-01-06 NOTE — ED Provider Notes (Signed)
CSN: [id number]     Arrival date & time 01/06/14  1232 History   First MD Initiated Contact with Patient 01/06/14 1236     Chief Complaint  Patient presents with  . Chest Pain     (Consider location/radiation/quality/duration/timing/severity/associated sxs/prior Treatment) HPI  This is a 38 year old male who presents with chest pain. Patient reports several week history of waxing and waning chest pain. He reports that it is burning in nature. It is sometimes associated with food. No association with exertion. Denies any cough, fever, shortness of breath. Patient does drive a truck for living. Denies any lower extremity swelling.  Most recent episode was this morning following breakfast. Denies any pain at this moment. Denies any nausea, vomiting, dizziness, diarrhea. History of "borderline high blood pressure." Denies family history of heart disease.  History reviewed. No pertinent past medical history. History reviewed. No pertinent past surgical history. History reviewed. No pertinent family history. History  Substance Use Topics  . Smoking status: Never Smoker   . Smokeless tobacco: Current User  . Alcohol Use: Yes     Comment: occasionally    Review of Systems  Constitutional: Negative.  Negative for fever.  Respiratory: Positive for chest tightness. Negative for shortness of breath.   Cardiovascular: Positive for chest pain. Negative for leg swelling.  Gastrointestinal: Negative.  Negative for nausea, vomiting and abdominal pain.  Genitourinary: Negative.  Negative for dysuria.  Musculoskeletal: Negative for back pain.  Skin: Negative for rash.  Neurological: Negative for dizziness and headaches.  All other systems reviewed and are negative.     Allergies  Review of patient's allergies indicates no known allergies.  Home Medications   Prior to Admission medications   Medication Sig Start Date End Date Taking? Authorizing Provider  ibuprofen (ADVIL,MOTRIN) 600 MG  tablet Take 1 tablet (600 mg total) by mouth every 6 (six) hours as needed. 04/24/13   Antony MaduraKelly Humes, PA-C  omeprazole (PRILOSEC) 20 MG capsule Take 1 capsule (20 mg total) by mouth daily. 01/06/14   Shon Batonourtney F Tnya Ades, MD  traMADol (ULTRAM) 50 MG tablet Take 1 tablet (50 mg total) by mouth every 6 (six) hours as needed. 04/24/13   Antony MaduraKelly Humes, PA-C   BP 159/101 mmHg  Pulse 74  Temp(Src) 98.5 F (36.9 C)  Resp 18  Ht 5\' 8"  (1.727 m)  Wt 260 lb (117.935 kg)  BMI 39.54 kg/m2  SpO2 100% Physical Exam  Constitutional: He is oriented to person, place, and time. He appears well-developed and well-nourished.  Overweight  HENT:  Head: Normocephalic and atraumatic.  Eyes: Pupils are equal, round, and reactive to light.  Neck: Neck supple.  Cardiovascular: Normal rate, regular rhythm and normal heart sounds.   No murmur heard. Pulmonary/Chest: Effort normal and breath sounds normal. No respiratory distress. He has no wheezes. He exhibits no tenderness.  Abdominal: Soft. There is no tenderness.  Musculoskeletal: He exhibits no edema.  Neurological: He is alert and oriented to person, place, and time.  Skin: Skin is warm and dry.  Psychiatric: He has a normal mood and affect.  Nursing note and vitals reviewed.   ED Course  Procedures (including critical care time) Labs Review Labs Reviewed  TROPONIN I  D-DIMER, QUANTITATIVE    Imaging Review Dg Chest 2 View  01/06/2014   CLINICAL DATA:  Chest pain.  EXAM: CHEST - 2 VIEW  COMPARISON:  None  FINDINGS: The heart size and mediastinal contours are within normal limits. Lung volumes are low with minimal bibasilar  atelectasis present. There is no evidence of pulmonary edema, consolidation, pneumothorax, nodule or pleural fluid. The visualized skeletal structures are unremarkable.  IMPRESSION: No active disease.   Electronically Signed   By: Irish LackGlenn  Yamagata M.D.   On: 01/06/2014 13:19     EKG Interpretation   Date/Time:  Sunday January 06 2014 12:41:43 EST Ventricular Rate:  77 PR Interval:  158 QRS Duration: 104 QT Interval:  390 QTC Calculation: 441 R Axis:   0 Text Interpretation:  Normal sinus rhythm Normal ECG Confirmed by Tressy Kunzman   MD, Toni AmendOURTNEY ([id number]) on 01/06/2014 2:14:02 PM      MDM   Final diagnoses:  Chest pain    Patient presents with chest pain. Somewhat atypical for ACS and more associated with food and described as burning. Only risk factor for ACS is borderline high blood pressure. EKG is normal, chest x-ray reassuring and troponin is negative. D-dimer is also negative as a screening test for blood clots. Discussed results with patient. We will empirical he start on a PPI for reflux symptoms. Patient was given [facility] follow-up and strict return precautions.  After history, exam, and medical workup I feel the patient has been appropriately medically screened and is safe for discharge home. Pertinent diagnoses were discussed with the patient. Patient was given return precautions.     Shon Batonourtney F Cuyler Vandyken, MD 01/06/14 [phone]

## 2014-01-06 NOTE — ED Notes (Signed)
Patient c/o intermittent central burning chest pain over the past two weeks, no sob,

## 2014-01-06 NOTE — Discharge Instructions (Signed)
You were evaluated today for chest pain. Your workup is reassuring. Your chest pain is likely related to reflux. We will start you on a acid reducer. See return precautions below.  Chest Pain (Nonspecific) It is often hard to give a specific diagnosis for the cause of chest pain. There is always a chance that your pain could be related to something serious, such as a heart attack or a blood clot in the lungs. You need to follow up with your health care provider for further evaluation. CAUSES   Heartburn.  Pneumonia or bronchitis.  Anxiety or stress.  Inflammation around your heart (pericarditis) or lung (pleuritis or pleurisy).  A blood clot in the lung.  A collapsed lung (pneumothorax). It can develop suddenly on its own (spontaneous pneumothorax) or from trauma to the chest.  Shingles infection (herpes zoster virus). The chest wall is composed of bones, muscles, and cartilage. Any of these can be the source of the pain.  The bones can be bruised by injury.  The muscles or cartilage can be strained by coughing or overwork.  The cartilage can be affected by inflammation and become sore (costochondritis). DIAGNOSIS  Lab tests or other studies may be needed to find the cause of your pain. Your health care provider may have you take a test called an ambulatory electrocardiogram (ECG). An ECG records your heartbeat patterns over a 24-hour period. You may also have other tests, such as:  Transthoracic echocardiogram (TTE). During echocardiography, sound waves are used to evaluate how blood flows through your heart.  Transesophageal echocardiogram (TEE).  Cardiac monitoring. This allows your health care provider to monitor your heart rate and rhythm in real time.  Holter monitor. This is a portable device that records your heartbeat and can help diagnose heart arrhythmias. It allows your health care provider to track your heart activity for several days, if needed.  Stress tests by  exercise or by giving medicine that makes the heart beat faster. TREATMENT   Treatment depends on what may be causing your chest pain. Treatment may include:  Acid blockers for heartburn.  Anti-inflammatory medicine.  Pain medicine for inflammatory conditions.  Antibiotics if an infection is present.  You may be advised to change lifestyle habits. This includes stopping smoking and avoiding alcohol, caffeine, and chocolate.  You may be advised to keep your head raised (elevated) when sleeping. This reduces the chance of acid going backward from your stomach into your esophagus. Most of the time, nonspecific chest pain will improve within 2-3 days with rest and mild pain medicine.  HOME CARE INSTRUCTIONS   If antibiotics were prescribed, take them as directed. Finish them even if you start to feel better.  For the next few days, avoid physical activities that bring on chest pain. Continue physical activities as directed.  Do not use any tobacco products, including cigarettes, chewing tobacco, or electronic cigarettes.  Avoid drinking alcohol.  Only take medicine as directed by your health care provider.  Follow your health care provider's suggestions for further testing if your chest pain does not go away.  Keep any follow-up appointments you made. If you do not go to an appointment, you could develop lasting (chronic) problems with pain. If there is any problem keeping an appointment, call to reschedule. SEEK MEDICAL CARE IF:   Your chest pain does not go away, even after treatment.  You have a rash with blisters on your chest.  You have a fever. SEEK IMMEDIATE MEDICAL CARE IF:  You have increased chest pain or pain that spreads to your arm, neck, jaw, back, or abdomen.  You have shortness of breath.  You have an increasing cough, or you cough up blood.  You have severe back or abdominal pain.  You feel nauseous or vomit.  You have severe weakness.  You  faint.  You have chills. This is an emergency. Do not wait to see if the pain will go away. Get medical help at once. Call your local emergency services (911 in [place] Do not drive yourself to the hospital. MAKE SURE YOU:   Understand these instructions.  Will watch your condition.  Will get help right away if you are not doing well or get worse. Document Released: 11/11/2004 Document Revised: 02/06/2013 Document Reviewed: 09/07/2007 [facility] Patient Information 2015 [place], [place]. This information is not intended to replace advice given to you by your health care provider. Make sure you discuss any questions you have with your health care provider.

## 2014-06-12 ENCOUNTER — Emergency Department (HOSPITAL_COMMUNITY): Payer: 59

## 2014-06-12 ENCOUNTER — Emergency Department (HOSPITAL_COMMUNITY)
Admission: EM | Admit: 2014-06-12 | Discharge: 2014-06-12 | Disposition: A | Discharge status: Home / Self Care | Payer: 59 | Attending: Emergency Medicine | Admitting: Emergency Medicine

## 2014-06-12 ENCOUNTER — Encounter (HOSPITAL_COMMUNITY): Payer: Self-pay | Admitting: *Deleted

## 2014-06-12 DIAGNOSIS — K219 Gastro-esophageal reflux disease without esophagitis: Secondary | ICD-10-CM | POA: Insufficient documentation

## 2014-06-12 DIAGNOSIS — F329 Major depressive disorder, single episode, unspecified: Secondary | ICD-10-CM | POA: Insufficient documentation

## 2014-06-12 DIAGNOSIS — R0789 Other chest pain: Secondary | ICD-10-CM | POA: Insufficient documentation

## 2014-06-12 DIAGNOSIS — R51 Headache: Secondary | ICD-10-CM | POA: Diagnosis not present

## 2014-06-12 DIAGNOSIS — F419 Anxiety disorder, unspecified: Secondary | ICD-10-CM | POA: Insufficient documentation

## 2014-06-12 DIAGNOSIS — R079 Chest pain, unspecified: Secondary | ICD-10-CM | POA: Diagnosis present

## 2014-06-12 DIAGNOSIS — Z7982 Long term (current) use of aspirin: Secondary | ICD-10-CM | POA: Insufficient documentation

## 2014-06-12 DIAGNOSIS — I10 Essential (primary) hypertension: Secondary | ICD-10-CM | POA: Insufficient documentation

## 2014-06-12 DIAGNOSIS — R519 Headache, unspecified: Secondary | ICD-10-CM

## 2014-06-12 HISTORY — DX: Essential (primary) hypertension: I10

## 2014-06-12 LAB — BASIC METABOLIC PANEL
ANION GAP: 11 (ref 5–15)
BUN: 13 mg/dL (ref 6–23)
CALCIUM: 9.8 mg/dL (ref 8.4–10.5)
CO2: 21 mmol/L (ref 19–32)
Chloride: 106 mmol/L (ref 96–112)
Creatinine, Ser: 0.97 mg/dL (ref 0.50–1.35)
GFR calc non Af Amer: >90 mL/min (ref >90)
GLUCOSE: 162 mg/dL — AB (ref 70–99)
POTASSIUM: 3.9 mmol/L (ref 3.5–5.1)
SODIUM: 138 mmol/L (ref 135–145)

## 2014-06-12 LAB — CBC
HCT: 45.2 % (ref 39.0–52.0)
Hemoglobin: 16 g/dL (ref 13.0–17.0)
MCH: 31.3 pg (ref 26.0–34.0)
MCHC: 35.4 g/dL (ref 30.0–36.0)
MCV: 88.5 fL (ref 78.0–100.0)
Platelets: 198 10*3/uL (ref 150–400)
RBC: 5.11 MIL/uL (ref 4.22–5.81)
RDW: 12.7 % (ref 11.5–15.5)
WBC: 6.8 10*3/uL (ref 4.0–10.5)

## 2014-06-12 LAB — I-STAT TROPONIN, ED: TROPONIN I, POC: 0 ng/mL (ref 0.00–0.08)

## 2014-06-12 MED ORDER — METOCLOPRAMIDE HCL 5 MG/ML IJ SOLN
10.0000 mg | Freq: Once | INTRAMUSCULAR | Status: AC
Start: 2014-06-12 — End: 2014-06-12
  Administered 2014-06-12: 10 mg via INTRAMUSCULAR
  Filled 2014-06-12: qty 2

## 2014-06-12 MED ORDER — DIPHENHYDRAMINE HCL 25 MG PO CAPS
50.0000 mg | ORAL_CAPSULE | Freq: Once | ORAL | Status: AC
  Administered 2014-06-12: 50 mg via ORAL
  Filled 2014-06-12: qty 2

## 2014-06-12 NOTE — ED Notes (Signed)
Pt c/o chest pressure x several weeks.  States that he was seen in November and was diagnosed with severe acid reflux. States he takes omeprazole for the acid reflux when it is really bad. States he has had a headache x 3 days. States he has taken ibuprofen and goody powders which help short term.

## 2014-06-12 NOTE — ED Notes (Signed)
Pt transported to radiology.

## 2014-06-12 NOTE — ED Provider Notes (Signed)
CSN: [id number]     Arrival date & time 06/12/14  1511 History   First MD Initiated Contact with Patient 06/12/14 1808     Chief Complaint  Patient presents with  . Headache  . Chest Pain     (Consider location/radiation/quality/duration/timing/severity/associated sxs/prior Treatment) HPI Comments: Patient with PMH chest pain, HTN but not requiring medications -- presents with c/o HA and CP. Headache is left-sided and has been occuring for 3 days. Started in the afternoon while playing basketball. Pain has been waxing and waning since that time. He gets some temporary relief with OTC meds. No fever. No head or neck injury. No manipulations. Patient denies signs of stroke including: facial droop, slurred speech, aphasia, weakness/numbness in extremities, vision change or loss, imbalance/trouble walking. Patient has had one similar headache in the past that was post-coital.   Patient also describes intermittent L lateral chest pain for the past year. Pain is worse with palpation and position. It is over his left lateral chest. Patient states that in the past it has been made worse with food. Sometimes he takes Nexium as needed which helps. No palpitations, shortness of breath, diaphoresis. Patient felt the chest pain this morning with his headache so he decided to come to the hospital. Patient states that if he didn't have a headache, he would not have come to the hospital for this. No other prior cardiac workup other than ED visit in 12/2013.  Patients wife questions whether his anxiety and depression over the death of his mother has made his symptoms worse. He did not have problems like this prior to that incident.   The history is provided by the patient and medical records.    Past Medical History  Diagnosis Date  . Acid reflux   . Hypertension    History reviewed. No pertinent past surgical history. No family history on file. History  Substance Use Topics  . Smoking status: Never  Smoker   . Smokeless tobacco: Current User  . Alcohol Use: Yes     Comment: occasionally    Review of Systems  Constitutional: Negative for fever and diaphoresis.  HENT: Negative for congestion, dental problem, rhinorrhea and sinus pressure.   Eyes: Negative for photophobia, discharge, redness and visual disturbance.  Respiratory: Negative for cough and shortness of breath.   Cardiovascular: Positive for chest pain. Negative for palpitations and leg swelling.  Gastrointestinal: Negative for nausea, vomiting and abdominal pain.  Genitourinary: Negative for dysuria.  Musculoskeletal: Negative for back pain, gait problem, neck pain and neck stiffness.  Skin: Negative for rash.  Neurological: Positive for headaches. Negative for syncope, speech difficulty, weakness, light-headedness and numbness.  Psychiatric/Behavioral: Negative for confusion. The patient is not nervous/anxious.     Allergies  Review of patient's allergies indicates no known allergies.  Home Medications   Prior to Admission medications   Medication Sig Start Date End Date Taking? Authorizing Provider  Aspirin-Acetaminophen-Caffeine (GOODY HEADACHE PO) Take 1 packet by mouth daily as needed (pain / headache).   Yes Historical Provider, MD  ibuprofen (ADVIL,MOTRIN) 600 MG tablet Take 1 tablet (600 mg total) by mouth every 6 (six) hours as needed. 04/24/13  Yes Antony Madura, PA-C  omeprazole (PRILOSEC) 20 MG capsule Take 1 capsule (20 mg total) by mouth daily. Patient taking differently: Take 20 mg by mouth daily as needed (heartburn).  01/06/14  Yes Shon Baton, MD  traMADol (ULTRAM) 50 MG tablet Take 1 tablet (50 mg total) by mouth every 6 (six) hours as  needed. Patient not taking: Reported on 06/12/2014 04/24/13   Antony MaduraKelly Humes, PA-C   BP 136/98 mmHg  Pulse 71  Temp(Src) 98 F (36.7 C) (Oral)  Resp 16  Ht 5\' 8"  (1.727 m)  Wt 265 lb (120.203 kg)  BMI 40.30 kg/m2  SpO2 98%   Physical Exam  Constitutional: He  is oriented to person, place, and time. He appears well-developed and well-nourished.  HENT:  Head: Normocephalic and atraumatic.  Right Ear: Tympanic membrane, external ear and ear canal normal.  Left Ear: Tympanic membrane, external ear and ear canal normal.  Nose: Nose normal.  Mouth/Throat: Uvula is midline, oropharynx is clear and moist and mucous membranes are normal. Mucous membranes are not dry.  Eyes: Conjunctivae, EOM and lids are normal. Pupils are equal, round, and reactive to light.  Neck: Trachea normal and normal range of motion. Neck supple. Normal carotid pulses and no JVD present. No muscular tenderness present. Carotid bruit is not present. No tracheal deviation present.  Cardiovascular: Normal rate, regular rhythm, S1 normal, S2 normal, normal heart sounds and intact distal pulses.  Exam reveals no distant heart sounds and no decreased pulses.   No murmur heard. Pulmonary/Chest: Effort normal and breath sounds normal. No respiratory distress. He has no wheezes. He exhibits tenderness (Tenderness exhibited with palpation of left lateral chest wall and anterior left chest wall. ).  Abdominal: Soft. Normal aorta and bowel sounds are normal. There is no tenderness. There is no rebound and no guarding.  Musculoskeletal: Normal range of motion. He exhibits no edema.       Cervical back: He exhibits normal range of motion, no tenderness and no bony tenderness.  Neurological: He is alert and oriented to person, place, and time. He has normal strength and normal reflexes. No cranial nerve deficit or sensory deficit. He exhibits normal muscle tone. He displays a negative Romberg sign. Coordination and gait normal. GCS eye subscore is 4. GCS verbal subscore is 5. GCS motor subscore is 6.  Skin: Skin is warm and dry. He is not diaphoretic. No cyanosis. No pallor.  Psychiatric: He has a normal mood and affect.  Nursing note and vitals reviewed.   ED Course  Procedures (including critical  care time) Labs Review Labs Reviewed  BASIC METABOLIC PANEL - Abnormal; Notable for the following:    Glucose, Bld 162 (*)    All other components within normal limits  CBC  I-STAT TROPOININ, ED    Imaging Review Dg Chest 2 View  06/12/2014   CLINICAL DATA:  39 year old male with a history of chest pain and headache  EXAM: CHEST - 2 VIEW  COMPARISON:  01/06/2014  FINDINGS: Cardiomediastinal silhouette projects within normal limits in size and contour. No confluent airspace disease, pneumothorax, or pleural effusion.  No displaced fracture.  Unremarkable appearance of the upper abdomen.  IMPRESSION: No radiographic evidence of acute cardiopulmonary disease.  Signed,  Yvone NeuJaime S. Loreta AveWagner, DO  [facility]  [facility]   Electronically Signed   By: Gilmer MorJaime  Wagner D.O.   On: 06/12/2014 17:46   Ct Head Wo Contrast  06/12/2014   CLINICAL DATA:  38yom, Has had a HA x 3 days. Pt has a Hx of HTN. Has had some CP  EXAM: CT HEAD WITHOUT CONTRAST  TECHNIQUE: Contiguous axial images were obtained from the base of the skull through the vertex without intravenous contrast.  COMPARISON:  None.  FINDINGS: Ventricles are normal in size and configuration. There are no parenchymal  masses or mass effect. There is no evidence of an infarct. No extra-axial masses or abnormal fluid collections.  There is no intracranial hemorrhage.  There is mild right ethmoid and right inferior frontal sinus mucosal thickening. Mastoid air cells are clear. No skull lesion.  IMPRESSION: 1. No intracranial abnormality. 2. Mild sinus mucosal thickening.   Electronically Signed   By: Amie Portland M.D.   On: 06/12/2014 19:35     EKG Interpretation None       6:31 PM Patient seen and examined. Work-up initiated. Medications ordered.   Vital signs reviewed and are as follows: BP 136/98 mmHg  Pulse 71  Temp(Src) 98 F (36.7 C) (Oral)  Resp 16  Ht  (1.727 m)  Wt 265 lb (120.203 kg)   BMI 40.30 kg/m2  SpO2 98%  ED ECG REPORT   Date: 06/12/2014  Rate: 95  Rhythm: normal sinus rhythm  QRS Axis: normal  Intervals: normal  ST/T Wave abnormalities: normal  Conduction Disutrbances:none  Narrative Interpretation:   Old EKG Reviewed: unchanged from 11/15  I have personally reviewed the EKG tracing and agree with the computerized printout as noted.  8:27 PM Patient noted improvement in HA after medication.   Patient and wife informed of head CT results.   We had a discussion about possibly missing an episode of bleeding that did not show up on CT tonight. Discussed that after 6 hrs after onset, up to 15% of bleeding may be missed if bleeding was caused by blood in the brain. We discussed that risk of this is low and that I have low suspicion that HA is being caused by an aneurysm. Discussed that the only way to know for sure at this point is with LP and that this is not always 100% predictive as well. We discussed risks and benefits. After this discussion, patient chooses to forego lumbar puncture at this time. We discussed that if he has worsening severe symptoms, especially including neurologic deficits, he needs to return to the emergency department immediately. Patient counseled to return if they have weakness in their arms or legs, slurred speech, trouble walking or talking, confusion, trouble with their balance, or if they have any other concerns. Patient and wife verbalize understanding and agrees with plan. I have recommended that he follow-up with a primary care physician regardless for further evaluation and treatment.   Will d/c to home. Encouraged rest.   MDM   Final diagnoses:  Headache  Chest wall pain   HA: CT performed and is negative. Discussion with patient and family as above regarding LP. Patient has a normal complete neurological exam, normal vital signs, normal level of consciousness, no signs of meningismus, is well-appearing/non-toxic appearing, no  signs of trauma. Clinically low suspicion for aneurysmal bleeding.   CP: Patient with chest tightness, most suspicious for chest wall pain. Feel patient is low risk for ACS given history (poor story for ACS/MI), negative troponin, normal/unchanged EKG. CXR neg. No concern for PE.   No dangerous or life-threatening conditions suspected or identified by history, physical exam, and by work-up. No indications for hospitalization identified.        Renne Crigler, PA-C 06/12/14 2037  Benjiman Core, MD 06/15/14 [phone]

## 2014-06-12 NOTE — Discharge Instructions (Signed)
Please read and follow all provided instructions.  Your diagnoses today include:  1. Headache   2. Chest wall pain    Tests performed today include:  CT of your head which was normal and did not show any serious cause of your headache  Chest x-ray - no problems with the lungs  Blood counts and electrolytes-negative  Blood test for heart muscle damage- No sign of heart attack  EKG-normal  Vital signs. See below for your results today.   Medications:  In the Emergency Department you received:  Reglan - antinausea/headache medication  Benadryl - antihistamine to counteract potential side effects of reglan  Take any prescribed medications only as directed.  Additional information:  Follow any educational materials contained in this packet.  You are having a headache. No specific cause was found today for your headache. It may have been a migraine or other cause of headache. Stress, anxiety, fatigue, and depression are common triggers for headaches.   Your headache today does not appear to be life-threatening or require hospitalization, but often the exact cause of headaches is not determined in the emergency department. Therefore, follow-up with your doctor is very important to find out what may have caused your headache and whether or not you need any further diagnostic testing or treatment.   Sometimes headaches can appear benign (not harmful), but then more serious symptoms can develop which should prompt an immediate re-evaluation by your doctor or the emergency department.  BE VERY CAREFUL not to take multiple medicines containing Tylenol (also called acetaminophen). Doing so can lead to an overdose which can damage your liver and cause liver failure and possibly death.   Follow-up instructions: Please follow-up with your primary care provider in the next 3 days for further evaluation of your symptoms.   Return instructions:   Please return to the Emergency Department if  you experience worsening symptoms.  Return if the medications do not resolve your headache, if it recurs, or if you have multiple episodes of vomiting or cannot keep down fluids.  Return if you have a change from the usual headache.  RETURN IMMEDIATELY IF you:  Develop a sudden, severe headache  Develop confusion or become poorly responsive or faint  Develop a fever above 100.93F or problem breathing  Have a change in speech, vision, swallowing, or understanding  Develop new weakness, numbness, tingling, incoordination in your arms or legs  Have a seizure  Please return if you have any other emergent concerns.  Additional Information:  Your vital signs today were: BP 136/98 mmHg   Pulse 71   Temp(Src) 98 F (36.7 C) (Oral)   Resp 16   Ht 5\' 8"  (1.727 m)   Wt 265 lb (120.203 kg)   BMI 40.30 kg/m2   SpO2 98% If your blood pressure (BP) was elevated above 135/85 this visit, please have this repeated by your doctor within one month. --------------

## 2014-08-20 ENCOUNTER — Other Ambulatory Visit: Payer: Self-pay | Admitting: Internal Medicine

## 2014-08-20 ENCOUNTER — Other Ambulatory Visit: Payer: 59 | Admitting: Internal Medicine

## 2014-08-20 DIAGNOSIS — Z13220 Encounter for screening for lipoid disorders: Secondary | ICD-10-CM

## 2014-08-20 DIAGNOSIS — Z0000 Encounter for general adult medical examination without abnormal findings: Secondary | ICD-10-CM

## 2014-08-20 LAB — COMPLETE METABOLIC PANEL WITH GFR
ALBUMIN: 4.5 g/dL (ref 3.5–5.2)
ALK PHOS: 35 U/L — AB (ref 39–117)
ALT: 39 U/L (ref 0–53)
AST: 20 U/L (ref 0–37)
BUN: 18 mg/dL (ref 6–23)
CO2: 25 mEq/L (ref 19–32)
Calcium: 9.8 mg/dL (ref 8.4–10.5)
Chloride: 103 mEq/L (ref 96–112)
Creat: 0.88 mg/dL (ref 0.50–1.35)
GLUCOSE: 159 mg/dL — AB (ref 70–99)
POTASSIUM: 4.4 meq/L (ref 3.5–5.3)
Sodium: 139 mEq/L (ref 135–145)
TOTAL PROTEIN: 6.8 g/dL (ref 6.0–8.3)
Total Bilirubin: 0.8 mg/dL (ref 0.2–1.2)

## 2014-08-20 LAB — CBC WITH DIFFERENTIAL/PLATELET
Basophils Absolute: 0.1 10*3/uL (ref 0.0–0.1)
Basophils Relative: 1 % (ref 0–1)
EOS ABS: 0.2 10*3/uL (ref 0.0–0.7)
Eosinophils Relative: 3 % (ref 0–5)
HEMATOCRIT: 47.5 % (ref 39.0–52.0)
Hemoglobin: 16.3 g/dL (ref 13.0–17.0)
Lymphocytes Relative: 37 % (ref 12–46)
Lymphs Abs: 2.2 10*3/uL (ref 0.7–4.0)
MCH: 30.5 pg (ref 26.0–34.0)
MCHC: 34.3 g/dL (ref 30.0–36.0)
MCV: 89 fL (ref 78.0–100.0)
MONO ABS: 0.5 10*3/uL (ref 0.1–1.0)
MPV: 10.4 fL (ref 8.6–12.4)
Monocytes Relative: 9 % (ref 3–12)
NEUTROS ABS: 3 10*3/uL (ref 1.7–7.7)
Neutrophils Relative %: 50 % (ref 43–77)
Platelets: 194 10*3/uL (ref 150–400)
RBC: 5.34 MIL/uL (ref 4.22–5.81)
RDW: 13.3 % (ref 11.5–15.5)
WBC: 5.9 10*3/uL (ref 4.0–10.5)

## 2014-08-20 LAB — LIPID PANEL
CHOL/HDL RATIO: 5 ratio
CHOLESTEROL: 245 mg/dL — AB (ref 0–200)
HDL: 49 mg/dL (ref >=40)
LDL CALC: 163 mg/dL — AB (ref 0–99)
Triglycerides: 166 mg/dL — ABNORMAL HIGH (ref <150)
VLDL: 33 mg/dL (ref 0–40)

## 2014-08-22 ENCOUNTER — Encounter: Payer: Self-pay | Admitting: Internal Medicine

## 2014-08-22 ENCOUNTER — Ambulatory Visit (INDEPENDENT_AMBULATORY_CARE_PROVIDER_SITE_OTHER): Payer: 59 | Admitting: Internal Medicine

## 2014-08-22 VITALS — BP 134/88 | HR 90 | Temp 98.8°F | Ht 68.0 in | Wt 263.0 lb

## 2014-08-22 DIAGNOSIS — E785 Hyperlipidemia, unspecified: Secondary | ICD-10-CM | POA: Diagnosis not present

## 2014-08-22 DIAGNOSIS — Z8719 Personal history of other diseases of the digestive system: Secondary | ICD-10-CM

## 2014-08-22 DIAGNOSIS — R739 Hyperglycemia, unspecified: Secondary | ICD-10-CM | POA: Diagnosis not present

## 2014-08-22 DIAGNOSIS — Z0000 Encounter for general adult medical examination without abnormal findings: Secondary | ICD-10-CM

## 2014-08-22 DIAGNOSIS — R030 Elevated blood-pressure reading, without diagnosis of hypertension: Secondary | ICD-10-CM

## 2014-08-22 DIAGNOSIS — E669 Obesity, unspecified: Secondary | ICD-10-CM | POA: Diagnosis not present

## 2014-08-22 DIAGNOSIS — E119 Type 2 diabetes mellitus without complications: Secondary | ICD-10-CM | POA: Diagnosis not present

## 2014-08-22 DIAGNOSIS — K219 Gastro-esophageal reflux disease without esophagitis: Secondary | ICD-10-CM

## 2014-08-22 LAB — POCT URINALYSIS DIPSTICK
Bilirubin, UA: NEGATIVE
Blood, UA: NEGATIVE
Leukocytes, UA: NEGATIVE
Nitrite, UA: NEGATIVE
Protein, UA: 30
Spec Grav, UA: >=1.03
Urobilinogen, UA: NEGATIVE
pH, UA: 5

## 2014-08-22 MED ORDER — GLIPIZIDE 5 MG PO TABS: 5.0000 mg | ORAL_TABLET | Freq: Two times a day (BID) | ORAL | Status: DC

## 2014-08-22 MED ORDER — GLUCOSE BLOOD VI STRP: ORAL_STRIP | Status: DC

## 2014-08-22 MED ORDER — ONETOUCH ULTRA SYSTEM W/DEVICE KIT: 1.0000 | PACK | Freq: Once | Status: DC

## 2014-08-22 MED ORDER — ATORVASTATIN CALCIUM 10 MG PO TABS: ORAL_TABLET | ORAL | Status: DC

## 2014-08-22 MED ORDER — LOSARTAN POTASSIUM 25 MG PO TABS: ORAL_TABLET | ORAL | Status: DC

## 2014-08-22 NOTE — Patient Instructions (Signed)
Take losartan every morning. Take Lipitor with supper. Take glipizide with breakfast and supper. Keep record of Accu-Cheks before breakfast and before supper. Call if Accu-Cheks persistently less than 100. Stay on 1800-2000-calorie diet daily. Return in 2 weeks

## 2014-08-23 LAB — MICROALBUMIN / CREATININE URINE RATIO
Creatinine, Urine: 256.5 mg/dL
MICROALB UR: 4.4 mg/dL — AB (ref <2.0)
Microalb Creat Ratio: 17.2 mg/g (ref 0.0–30.0)

## 2014-08-23 LAB — HEMOGLOBIN A1C
Hgb A1c MFr Bld: 7 % — ABNORMAL HIGH (ref <5.7)
Mean Plasma Glucose: 154 mg/dL — ABNORMAL HIGH (ref <117)

## 2014-09-05 ENCOUNTER — Ambulatory Visit (INDEPENDENT_AMBULATORY_CARE_PROVIDER_SITE_OTHER): Payer: 59 | Admitting: Internal Medicine

## 2014-09-05 ENCOUNTER — Encounter: Payer: Self-pay | Admitting: Internal Medicine

## 2014-09-05 VITALS — BP 122/88 | HR 73 | Temp 98.7°F | Wt 268.0 lb

## 2014-09-05 DIAGNOSIS — R030 Elevated blood-pressure reading, without diagnosis of hypertension: Secondary | ICD-10-CM

## 2014-09-05 DIAGNOSIS — E785 Hyperlipidemia, unspecified: Secondary | ICD-10-CM | POA: Diagnosis not present

## 2014-09-05 DIAGNOSIS — E669 Obesity, unspecified: Secondary | ICD-10-CM | POA: Diagnosis not present

## 2014-09-05 DIAGNOSIS — E8881 Metabolic syndrome: Secondary | ICD-10-CM | POA: Diagnosis not present

## 2014-09-05 DIAGNOSIS — E119 Type 2 diabetes mellitus without complications: Secondary | ICD-10-CM

## 2014-09-05 DIAGNOSIS — K219 Gastro-esophageal reflux disease without esophagitis: Secondary | ICD-10-CM | POA: Diagnosis not present

## 2014-09-05 MED ORDER — GLIPIZIDE 5 MG PO TABS: ORAL_TABLET | ORAL | Status: DC

## 2014-09-14 ENCOUNTER — Encounter: Payer: Self-pay | Admitting: Internal Medicine

## 2014-09-14 DIAGNOSIS — K219 Gastro-esophageal reflux disease without esophagitis: Secondary | ICD-10-CM | POA: Insufficient documentation

## 2014-09-14 DIAGNOSIS — E119 Type 2 diabetes mellitus without complications: Secondary | ICD-10-CM | POA: Insufficient documentation

## 2014-09-14 DIAGNOSIS — E785 Hyperlipidemia, unspecified: Secondary | ICD-10-CM | POA: Insufficient documentation

## 2014-09-14 DIAGNOSIS — E669 Obesity, unspecified: Secondary | ICD-10-CM | POA: Insufficient documentation

## 2014-09-14 DIAGNOSIS — R030 Elevated blood-pressure reading, without diagnosis of hypertension: Secondary | ICD-10-CM | POA: Insufficient documentation

## 2014-09-14 NOTE — Patient Instructions (Signed)
Recommend 1800-calorie ADA diet. Continue same medications. Follow-up in one month. Refer to nutritionist.

## 2014-09-14 NOTE — Progress Notes (Signed)
   Subjective:    Patient ID: Joel Maldonado, male    DOB: 09-23-1975, 39 y.o.   MRN: [id number]  HPI 39 year old male presents to the office for the first time today. He is a son-in-law with [employer]. Issues with headaches, labile blood pressure and chest pain.  No history of serious  accidents or operations.  History of GE reflux for which she has been taking when necessary omeprazole.  No known drug allergies.  Social history: He is married to [place]. He does not smoke. Drinks beer socially. Completed 10th grade. He is a Hospital doctor for [employer]  [place]  Father died at 27 with history of paraplegia, history of smoking, COPD but says cause of death was a "collapsed lung ". Mother died at age 85 with history of breast and lung cancer. She was a smoker. Apparently had some sort of allergic reaction as well. No siblings. No children.  Recent lab work done fasting through this office shows a total cholesterol of 245, LDL cholesterol 163, normal CBC, fasting glucose of 159. Normal BUN and creatinine. Normal liver functions.  Seen in the emergency department April 2016 complaining of chest pain and headache. Chest x-ray was negative. He had had a headache for some 3 days. CT scan of the brain done at that time was also negative. He does use smokeless tobacco. Diagnosed with acute diverticulitis in the emergency department   July 2014.  He is overweight.   Review of Systems  Constitutional: Negative.   Eyes: Negative.   Respiratory: Negative.   Cardiovascular: Negative.   Genitourinary: Negative.   Psychiatric/Behavioral: Negative.        Objective:   Physical Exam  Constitutional: He is oriented to person, place, and time. He appears well-developed and well-nourished. No distress.  HENT:  Head: Normocephalic and atraumatic.  Right Ear: External ear normal.  Left Ear: External ear normal.  Mouth/Throat: Oropharynx is clear and moist.  Eyes: Pupils are equal, round, and reactive to  light.  Neck: Neck supple. No JVD present. No thyromegaly present.  Cardiovascular: Normal rate, normal heart sounds and intact distal pulses.   No murmur heard. Pulmonary/Chest: Effort normal and breath sounds normal. No respiratory distress. He has no wheezes. He has no rales.  Abdominal: He exhibits no distension and no mass. There is no tenderness. There is no guarding.  Musculoskeletal: He exhibits no edema.  Lymphadenopathy:    He has no cervical adenopathy.  Neurological: He is alert and oriented to person, place, and time. He has normal reflexes.  Skin: Skin is warm and dry. No rash noted. He is not diaphoretic.  Psychiatric: He has a normal mood and affect. His behavior is normal. Judgment and thought content normal.  Vitals reviewed.         Assessment & Plan:   hyperlipidemia. Total cholesterol 245 with an LDL cholesterol of 163.  New-onset diabetes mellitus  Obesity  Borderline hypertension  History of diverticulitis 2014  GE reflux  Plan: Spent considerable amount of time talking with patient and wife about diet and exercise. Patient will take omeprazole daily. Start losartan 25 mg daily. Start Lipitor. Monitor Accu-Cheks before meals and at bedtime. Glipizide 5 mg before breakfast and supper. Return in 2 weeks.

## 2014-09-14 NOTE — Progress Notes (Addendum)
   Subjective:    Patient ID: XADEN KAUFMAN, male    DOB: [birth year], 39 y.o.   MRN: [id number]  HPI  39 year old male in today to follow-up on type 2 diabetes mellitus and borderline hypertension. Last visit hemoglobin A1c was 7%. He's been trying to watch diet and exercise. This is been difficult. He is on glipizide twice daily, losartan 25 mg daily, PPI, lipid-lowering medication. Reflux improved on PPI.    Review of Systems     Objective:   Physical Exam Spent 20 minutes speaking with patient about these issues. He is going to need dietary consultation. Reviewed healthy food choices. Should maintain an 1800-calorie ADA diet. Needs to get some exercise daily. Needs to watch beer consumption. Talked with him about healthy food choices.       Assessment & Plan:  New-onset diabetes mellitus-currently being treated with glipizide  GE reflux-on daily PPI  Hyperlipidemia-on statin medication  Borderline hypertension  Plan: Return in one month. Is going to need dietary consultation. Try to get exercise daily and watch food choices. 1800-calorie ADA diet recommended.

## 2014-09-24 ENCOUNTER — Ambulatory Visit: Payer: 59

## 2014-10-01 ENCOUNTER — Ambulatory Visit: Payer: 59

## 2014-10-04 ENCOUNTER — Encounter: Payer: Self-pay | Admitting: Internal Medicine

## 2014-10-04 ENCOUNTER — Ambulatory Visit (INDEPENDENT_AMBULATORY_CARE_PROVIDER_SITE_OTHER): Payer: 59 | Admitting: Internal Medicine

## 2014-10-04 VITALS — BP 134/88 | HR 81 | Temp 98.0°F | Wt 266.0 lb

## 2014-10-04 DIAGNOSIS — E669 Obesity, unspecified: Secondary | ICD-10-CM

## 2014-10-04 DIAGNOSIS — R030 Elevated blood-pressure reading, without diagnosis of hypertension: Secondary | ICD-10-CM | POA: Diagnosis not present

## 2014-10-04 DIAGNOSIS — E119 Type 2 diabetes mellitus without complications: Secondary | ICD-10-CM

## 2014-10-04 MED ORDER — LOSARTAN POTASSIUM 25 MG PO TABS: ORAL_TABLET | ORAL | Status: DC

## 2014-10-04 MED ORDER — GLIPIZIDE 5 MG PO TABS: 5.0000 mg | ORAL_TABLET | Freq: Every day | ORAL | Status: DC

## 2014-10-04 MED ORDER — ATORVASTATIN CALCIUM 10 MG PO TABS: ORAL_TABLET | ORAL | Status: DC

## 2014-10-04 NOTE — Patient Instructions (Addendum)
Return 4 weeks after meeting with dietitian and a trial of diet after being educated by dietitian. Do not take glipizide unless Accu-Cheks are running consistently high between 130and 150 or greater. Continue diet and exercise efforts. Consider ACE inhibitor at next visit.

## 2014-10-04 NOTE — Progress Notes (Signed)
   Subjective:    Patient ID: Joel Maldonado, male    DOB: Jun 23, 1975, 39 y.o.   MRN: [id number]  HPI Here today to follow-up on controlled type 2 diabetes mellitus. He has yet see dietitian. Apparently was unable to keep appointment made previously. I thought that he is eating enough calories. We went over another typical dietary history for him.  I have given him a list of foods with calories. He's been taking glipizide about every other day. Sometimes Accu-Chek is 80 in the mornings and he feels he should not have to take glipizide if it's that low. On one occasion Accu-Chek was 60 and he felt a bit shaky. He's lost 2 pounds since last visit. Says he is trying very hard to eat correctly. Is avoiding sugary drinks. If Accu-Cheks are 130 he generally goes ahead and takes his glipizide.    Review of Systems     Objective:   Physical Exam  Not examined. Spent 20 minutes going over food choices and his dietary history. Explained how glipizide works. Blood pressures borderline elevated.      Assessment & Plan:  Controlled type 2 diabetes mellitus  Obesity  Borderline hypertension  Plan: I think he needs to hold his glipizide for several days and continue Accu-Cheks and increased caloric consumption. He understands that if Accu-Cheks are running high in the 1:30 to 150 range she should go back on glipizide. When he came in or the first time, his fasting glucose was 159. He needs to continue diet exercise and weight loss efforts. I really think he needs to meet with dietitian to understand what to eat.  Consider ACE inhibitor at next visit  I have told him that once he is met with dietitian and tried diet after being educated for 4 weeks we will see him back in drawn hemoglobin A1c. We did not do this today because he had not consistently been on glipizide nor do I think he is eating enough calories.

## 2014-10-08 ENCOUNTER — Other Ambulatory Visit: Payer: Self-pay | Admitting: *Deleted

## 2014-10-08 ENCOUNTER — Ambulatory Visit: Payer: 59

## 2014-10-08 MED ORDER — GLUCOSE BLOOD VI STRP: ORAL_STRIP | Status: AC

## 2014-10-08 NOTE — Telephone Encounter (Signed)
Glucose test strips one touch ultra sent to pharmacy

## 2014-12-30 ENCOUNTER — Emergency Department (HOSPITAL_BASED_OUTPATIENT_CLINIC_OR_DEPARTMENT_OTHER)
Admission: EM | Admit: 2014-12-30 | Discharge: 2014-12-30 | Disposition: A | Discharge status: Home / Self Care | Payer: 59 | Attending: Emergency Medicine | Admitting: Emergency Medicine

## 2014-12-30 ENCOUNTER — Encounter (HOSPITAL_BASED_OUTPATIENT_CLINIC_OR_DEPARTMENT_OTHER): Payer: Self-pay

## 2014-12-30 ENCOUNTER — Emergency Department (HOSPITAL_BASED_OUTPATIENT_CLINIC_OR_DEPARTMENT_OTHER): Payer: 59

## 2014-12-30 DIAGNOSIS — I10 Essential (primary) hypertension: Secondary | ICD-10-CM | POA: Diagnosis not present

## 2014-12-30 DIAGNOSIS — Z79899 Other long term (current) drug therapy: Secondary | ICD-10-CM | POA: Diagnosis not present

## 2014-12-30 DIAGNOSIS — R109 Unspecified abdominal pain: Secondary | ICD-10-CM | POA: Diagnosis not present

## 2014-12-30 LAB — BASIC METABOLIC PANEL
Anion gap: 6 (ref 5–15)
BUN: 19 mg/dL (ref 6–20)
CO2: 27 mmol/L (ref 22–32)
CREATININE: 0.96 mg/dL (ref 0.61–1.24)
Calcium: 9.7 mg/dL (ref 8.9–10.3)
Chloride: 103 mmol/L (ref 101–111)
GFR calc non Af Amer: >60 mL/min (ref >60)
Glucose, Bld: 122 mg/dL — ABNORMAL HIGH (ref 65–99)
Potassium: 3.9 mmol/L (ref 3.5–5.1)
SODIUM: 136 mmol/L (ref 135–145)

## 2014-12-30 LAB — CBC WITH DIFFERENTIAL/PLATELET
Basophils Absolute: 0.1 10*3/uL (ref 0.0–0.1)
Basophils Relative: 1 %
EOS ABS: 0.1 10*3/uL (ref 0.0–0.7)
Eosinophils Relative: 1 %
HCT: 47.8 % (ref 39.0–52.0)
Hemoglobin: 16.6 g/dL (ref 13.0–17.0)
Lymphocytes Relative: 31 %
Lymphs Abs: 2 10*3/uL (ref 0.7–4.0)
MCH: 30.8 pg (ref 26.0–34.0)
MCHC: 34.7 g/dL (ref 30.0–36.0)
MCV: 88.7 fL (ref 78.0–100.0)
MONOS PCT: 9 %
Monocytes Absolute: 0.6 10*3/uL (ref 0.1–1.0)
Neutro Abs: 3.7 10*3/uL (ref 1.7–7.7)
Neutrophils Relative %: 58 %
Platelets: 195 10*3/uL (ref 150–400)
RBC: 5.39 MIL/uL (ref 4.22–5.81)
RDW: 12.9 % (ref 11.5–15.5)
WBC: 6.3 10*3/uL (ref 4.0–10.5)

## 2014-12-30 LAB — URINALYSIS, ROUTINE W REFLEX MICROSCOPIC
BILIRUBIN URINE: NEGATIVE
Glucose, UA: NEGATIVE mg/dL
Hgb urine dipstick: NEGATIVE
KETONES UR: NEGATIVE mg/dL
LEUKOCYTES UA: NEGATIVE
NITRITE: NEGATIVE
PROTEIN: NEGATIVE mg/dL
Specific Gravity, Urine: 1.019 (ref 1.005–1.030)
Urobilinogen, UA: 1 mg/dL (ref 0.0–1.0)
pH: 6.5 (ref 5.0–8.0)

## 2014-12-30 MED ORDER — ONDANSETRON HCL 4 MG/2ML IJ SOLN
4.0000 mg | Freq: Once | INTRAMUSCULAR | Status: AC
  Administered 2014-12-30: 4 mg via INTRAVENOUS
  Filled 2014-12-30: qty 2

## 2014-12-30 MED ORDER — IBUPROFEN 800 MG PO TABS: 800.0000 mg | ORAL_TABLET | Freq: Three times a day (TID) | ORAL | Status: DC

## 2014-12-30 MED ORDER — MORPHINE SULFATE (PF) 4 MG/ML IV SOLN
4.0000 mg | Freq: Once | INTRAVENOUS | Status: AC
  Administered 2014-12-30: 4 mg via INTRAVENOUS
  Filled 2014-12-30: qty 1

## 2014-12-30 NOTE — ED Notes (Signed)
Reports right sided back pain and flank pain that started approx 1 hr ago.  Denies injury.  Reports radiating into stomach

## 2014-12-30 NOTE — Discharge Instructions (Signed)
Flank Pain °Flank pain refers to pain that is located on the side of the body between the upper abdomen and the back. The pain may occur over a short period of time (acute) or may be long-term or reoccurring (chronic). It may be mild or severe. Flank pain can be caused by many things. °CAUSES  °Some of the more common causes of flank pain include: °· Muscle strains.   °· Muscle spasms.   °· A disease of your spine (vertebral disk disease).   °· A lung infection (pneumonia).   °· Fluid around your lungs (pulmonary edema).   °· A kidney infection.   °· Kidney stones.   °· A very painful skin rash caused by the chickenpox virus (shingles).   °· Gallbladder disease.   °HOME CARE INSTRUCTIONS  °Home care will depend on the cause of your pain. In general, °· Rest as directed by your caregiver. °· Drink enough fluids to keep your urine clear or pale yellow. °· Only take over-the-counter or prescription medicines as directed by your caregiver. Some medicines may help relieve the pain. °· Tell your caregiver about any changes in your pain. °· Follow up with your caregiver as directed. °SEEK IMMEDIATE MEDICAL CARE IF:  °· Your pain is not controlled with medicine.   °· You have new or worsening symptoms. °· Your pain increases.   °· You have abdominal pain.   °· You have shortness of breath.   °· You have persistent nausea or vomiting.   °· You have swelling in your abdomen.   °· You feel faint or pass out.   °· You have blood in your urine. °· You have a fever or persistent symptoms for more than 2-3 days. °· You have a fever and your symptoms suddenly get worse. °MAKE SURE YOU:  °· Understand these instructions. °· Will watch your condition. °· Will get help right away if you are not doing well or get worse. °  °This information is not intended to replace advice given to you by your health care provider. Make sure you discuss any questions you have with your health care provider. °  °Document Released: 03/25/2005 Document  Revised: 10/27/2011 Document Reviewed: 09/16/2011 °Elsevier Interactive Patient Education ©2016 [employer] ° °

## 2014-12-30 NOTE — ED Provider Notes (Signed)
CSN: [id number]     Arrival date & time 12/30/14  1024 History   First MD Initiated Contact with Patient 12/30/14 1105     Chief Complaint  Patient presents with  . Flank Pain     (Consider location/radiation/quality/duration/timing/severity/associated sxs/prior Treatment) Patient is a 39 y.o. male presenting with flank pain. The history is provided by the patient. No language interpreter was used.  Flank Pain This is a new problem. The current episode started today. The problem occurs constantly. The problem has been unchanged. Associated symptoms include abdominal pain. Pertinent negatives include no fever. Nothing aggravates the symptoms. He has tried nothing for the symptoms.    Past Medical History  Diagnosis Date  . Hypertension    History reviewed. No pertinent past surgical history. Family History  Problem Relation Age of Onset  . Cancer Mother   . COPD Father    Social History  Substance Use Topics  . Smoking status: Never Smoker   . Smokeless tobacco: Current User  . Alcohol Use: Yes     Comment: occasionally    Review of Systems  Constitutional: Negative for fever.  Gastrointestinal: Positive for abdominal pain.  Genitourinary: Positive for flank pain.  All other systems reviewed and are negative.     Allergies  Review of patient's allergies indicates no known allergies.  Home Medications   Prior to Admission medications   Medication Sig Start Date End Date Taking? Authorizing Provider  glipiZIDE (GLUCOTROL) 5 MG tablet Take 1 tablet (5 mg total) by mouth daily before breakfast. 10/04/14  Yes Elby Showers, MD  losartan (COZAAR) 25 MG tablet One po q am 10/04/14  Yes Elby Showers, MD  omeprazole (PRILOSEC) 20 MG capsule Take 1 capsule (20 mg total) by mouth daily. Patient taking differently: Take 20 mg by mouth daily as needed (heartburn).  01/06/14  Yes Merryl Hacker, MD  Aspirin-Acetaminophen-Caffeine (GOODY HEADACHE PO) Take 1 packet by mouth  daily as needed (pain / headache).    Historical Provider, MD  atorvastatin (LIPITOR) 10 MG tablet One po a day at supper 10/04/14   Elby Showers, MD  Blood Glucose Monitoring Suppl (ONE TOUCH ULTRA SYSTEM KIT) W/DEVICE KIT 1 kit by Does not apply route once. Check Blood Sugars before breakfast and supper 08/22/14   Elby Showers, MD  glucose blood test strip Check blood sugar before breakfast and before supper 10/08/14   Elby Showers, MD  ibuprofen (ADVIL,MOTRIN) 600 MG tablet Take 1 tablet (600 mg total) by mouth every 6 (six) hours as needed. 04/24/13   Antonietta Breach, PA-C  ibuprofen (ADVIL,MOTRIN) 800 MG tablet Take 1 tablet (800 mg total) by mouth 3 (three) times daily. 12/30/14   Glendell Docker, NP   BP 143/110 mmHg  Pulse 64  Temp(Src) 98.4 F (36.9 C) (Oral)  Resp 16  Ht _0  (1.727 m)  Wt 270 lb (122.471 kg)  BMI 41.06 kg/m2  SpO2 97% Physical Exam  Constitutional: He is oriented to person, place, and time. He appears well-developed and well-nourished.  Cardiovascular: Normal rate and regular rhythm.   Pulmonary/Chest: Effort normal.  Abdominal: Soft. Bowel sounds are normal. There is no tenderness.  Musculoskeletal: Normal range of motion.  Neurological: He is alert and oriented to person, place, and time.  Skin: Skin is dry.  Nursing note and vitals reviewed.   ED Course  Procedures (including critical care time) Labs Review Labs Reviewed  BASIC METABOLIC PANEL - Abnormal; Notable for the following:  Glucose, Bld 122 (*)    All other components within normal limits  URINALYSIS, ROUTINE W REFLEX MICROSCOPIC (NOT AT [facility])  CBC WITH DIFFERENTIAL/PLATELET    Imaging Review Ct Renal Stone Study  12/30/2014  CLINICAL DATA:  Right flank pain starting this morning EXAM: CT ABDOMEN AND PELVIS WITHOUT CONTRAST TECHNIQUE: Multidetector CT imaging of the abdomen and pelvis was performed following the standard protocol without IV contrast. COMPARISON:  08/27/2012 FINDINGS: Lung  bases are unremarkable. Sagittal images of the spine shows mild degenerative changes lower thoracic and lumbar spine. No aortic aneurysm. Unenhanced liver, spleen, pancreas and adrenal glands are unremarkable. No calcified gallstones are noted within gallbladder. Unenhanced kidneys are symmetrical in size. No hydronephrosis or hydroureter. There is 3 mm calcified nonobstructive calculus in midpole of the left kidney. No calcified ureteral calculi. No small bowel obstruction. No ascites or free air. No adenopathy. Few diverticula are noted descending colon. Multiple sigmoid colon diverticula. No evidence of acute diverticulitis. Prostate gland and seminal vesicles are unremarkable. There is no pericecal inflammation. The terminal ileum is unremarkable. Normal appendix is noted in axial image 60. No calcified calculi are noted within urinary bladder. The IMPRESSION: 1. There is left nonobstructive nephrolithiasis. No hydronephrosis or hydroureter. 2. No calcified ureteral calculi. 3. No pericecal inflammation.  Normal appendix. 4. Distal colonic diverticulosis. No evidence of acute diverticulitis. 5. No small bowel obstruction. Electronically Signed   By: Lahoma Crocker M.D.   On: 12/30/2014 12:47   I have personally reviewed and evaluated these images and lab results as part of my medical decision-making.   EKG Interpretation None      MDM   Final diagnoses:  Right flank pain    No acute reason for symptoms. Will treat with ibuprofen for symptoms. Discussed follow up with pcp for continued symptoms    Glendell Docker, NP 12/30/14 1259  Fredia Sorrow, MD 12/30/14 1511

## 2015-02-13 ENCOUNTER — Encounter (HOSPITAL_BASED_OUTPATIENT_CLINIC_OR_DEPARTMENT_OTHER): Payer: Self-pay | Admitting: Emergency Medicine

## 2015-02-13 ENCOUNTER — Emergency Department (HOSPITAL_BASED_OUTPATIENT_CLINIC_OR_DEPARTMENT_OTHER)
Admission: EM | Admit: 2015-02-13 | Discharge: 2015-02-13 | Disposition: A | Discharge status: Home / Self Care | Payer: 59 | Attending: Emergency Medicine | Admitting: Emergency Medicine

## 2015-02-13 ENCOUNTER — Other Ambulatory Visit: Payer: Self-pay

## 2015-02-13 ENCOUNTER — Emergency Department (HOSPITAL_BASED_OUTPATIENT_CLINIC_OR_DEPARTMENT_OTHER): Payer: 59

## 2015-02-13 DIAGNOSIS — Z79899 Other long term (current) drug therapy: Secondary | ICD-10-CM | POA: Insufficient documentation

## 2015-02-13 DIAGNOSIS — M79602 Pain in left arm: Secondary | ICD-10-CM | POA: Diagnosis not present

## 2015-02-13 DIAGNOSIS — R202 Paresthesia of skin: Secondary | ICD-10-CM | POA: Diagnosis present

## 2015-02-13 DIAGNOSIS — R51 Headache: Secondary | ICD-10-CM | POA: Diagnosis not present

## 2015-02-13 DIAGNOSIS — R200 Anesthesia of skin: Secondary | ICD-10-CM | POA: Diagnosis not present

## 2015-02-13 DIAGNOSIS — I10 Essential (primary) hypertension: Secondary | ICD-10-CM | POA: Diagnosis not present

## 2015-02-13 DIAGNOSIS — M542 Cervicalgia: Secondary | ICD-10-CM

## 2015-02-13 MED ORDER — IBUPROFEN 800 MG PO TABS
800.0000 mg | ORAL_TABLET | Freq: Once | ORAL | Status: AC
  Administered 2015-02-13: 800 mg via ORAL
  Filled 2015-02-13: qty 1

## 2015-02-13 MED ORDER — LOSARTAN POTASSIUM 25 MG PO TABS: ORAL_TABLET | ORAL | Status: DC

## 2015-02-13 NOTE — ED Notes (Signed)
Patient states that he was in an injury about 1 week ago. The patient reports that since last night he has had numbness and tingling to his left arm and shoulder.

## 2015-02-13 NOTE — ED Notes (Signed)
Report given and [facility] [place]

## 2015-02-13 NOTE — ED Provider Notes (Signed)
CSN: [id number]     Arrival date & time 02/13/15  0018 History   First MD Initiated Contact with Patient 02/13/15 0033     Chief Complaint  Patient presents with  . Arm Pain     (Consider location/radiation/quality/duration/timing/severity/associated sxs/prior Treatment) HPI Comments: 2 weeks ago was on the job, bent down to pick up a printer and felt something in neck Today began to have numbness when laying down on left side Whole left side felt tingling, shoulder, arm and leg Now not feeling the tingling as much Was sleeping on that side when woke up with symptoms No weakness No loss control bowel/bladder Constipation, taking laxitive Has slept on left side whole life without symptoms Neck pain still there No falls/trauma    Patient is a 39 y.o. male presenting with arm pain.  Arm Pain Associated symptoms include headaches. Pertinent negatives include no chest pain, no abdominal pain and no shortness of breath.    Past Medical History  Diagnosis Date  . Hypertension    History reviewed. No pertinent past surgical history. Family History  Problem Relation Age of Onset  . Cancer Mother   . COPD Father    Social History  Substance Use Topics  . Smoking status: Never Smoker   . Smokeless tobacco: Current User  . Alcohol Use: Yes     Comment: occasionally    Review of Systems  Constitutional: Negative for fever.  HENT: Negative for sore throat.   Eyes: Negative for visual disturbance.  Respiratory: Negative for shortness of breath.   Cardiovascular: Negative for chest pain.  Gastrointestinal: Negative for abdominal pain.  Genitourinary: Negative for difficulty urinating.  Musculoskeletal: Positive for neck pain. Negative for back pain and neck stiffness.  Skin: Negative for rash.  Neurological: Positive for numbness and headaches. Negative for tremors, syncope, facial asymmetry, weakness and light-headedness.      Allergies  Review of patient's allergies  indicates no known allergies.  Home Medications   Prior to Admission medications   Medication Sig Start Date End Date Taking? Authorizing Provider  Aspirin-Acetaminophen-Caffeine (GOODY HEADACHE PO) Take 1 packet by mouth daily as needed (pain / headache).    Historical Provider, MD  atorvastatin (LIPITOR) 10 MG tablet One po a day at supper 10/04/14   Elby Showers, MD  Blood Glucose Monitoring Suppl (ONE TOUCH ULTRA SYSTEM KIT) W/DEVICE KIT 1 kit by Does not apply route once. Check Blood Sugars before breakfast and supper 08/22/14   Elby Showers, MD  glipiZIDE (GLUCOTROL) 5 MG tablet Take 1 tablet (5 mg total) by mouth daily before breakfast. 10/04/14   Elby Showers, MD  glucose blood test strip Check blood sugar before breakfast and before supper 10/08/14   Elby Showers, MD  ibuprofen (ADVIL,MOTRIN) 600 MG tablet Take 1 tablet (600 mg total) by mouth every 6 (six) hours as needed. 04/24/13   Antonietta Breach, PA-C  ibuprofen (ADVIL,MOTRIN) 800 MG tablet Take 1 tablet (800 mg total) by mouth 3 (three) times daily. 12/30/14   Glendell Docker, NP  losartan (COZAAR) 25 MG tablet One po q am 02/13/15   Elby Showers, MD  omeprazole (PRILOSEC) 20 MG capsule Take 1 capsule (20 mg total) by mouth daily. Patient taking differently: Take 20 mg by mouth daily as needed (heartburn).  01/06/14   Merryl Hacker, MD   BP 116/84 mmHg  Pulse 84  Temp(Src) 98.6 F (37 C) (Oral)  Resp 18  Ht 5' 8"  (1.727 m)  Wt 286 lb (129.729 kg)  BMI 43.50 kg/m2  SpO2 100% Physical Exam  Constitutional: He is oriented to person, place, and time. He appears well-developed and well-nourished. No distress.  HENT:  Head: Normocephalic and atraumatic.  Eyes: Conjunctivae and EOM are normal.  Neck: Normal range of motion.  Cardiovascular: Normal rate, regular rhythm, normal heart sounds and intact distal pulses.  Exam reveals no gallop and no friction rub.   No murmur heard. Pulmonary/Chest: Effort normal and breath sounds  normal. No respiratory distress. He has no wheezes. He has no rales.  Abdominal: Soft. He exhibits no distension. There is no tenderness. There is no guarding.  Musculoskeletal: He exhibits no edema.  Neurological: He is alert and oriented to person, place, and time. He has normal strength. He displays no tremor. No cranial nerve deficit or sensory deficit. Coordination and gait normal. GCS eye subscore is 4. GCS verbal subscore is 5. GCS motor subscore is 6.  Skin: Skin is warm and dry. He is not diaphoretic.  Nursing note and vitals reviewed.   ED Course  Procedures (including critical care time) Labs Review Labs Reviewed - No data to display  Imaging Review Ct Head Wo Contrast  02/13/2015  CLINICAL DATA:  39 year old male with left-sided headache and left shoulder and arm pain. EXAM: CT HEAD WITHOUT CONTRAST TECHNIQUE: Contiguous axial images were obtained from the base of the skull through the vertex without intravenous contrast. COMPARISON:  CT dated 06/12/2014 FINDINGS: The ventricles and the sulci are appropriate in size for the patient's age. There is no intracranial hemorrhage. No midline shift or mass effect identified. The gray-white matter differentiation is preserved. The visualized paranasal sinuses and mastoid air cells are well aerated. The calvarium is intact. IMPRESSION: No acute intracranial pathology. Electronically Signed   By: Anner Crete M.D.   On: 02/13/2015 02:20   I have personally reviewed and evaluated these images and lab results as part of my medical decision-making.   EKG Interpretation None      MDM   Final diagnoses:  Numbness and tingling of left arm and leg  Neck pain   39yo male with history of hypertension, recent posterior neck pain which developed after bending over presents with concern for waking with left arm and leg tingling today.  Patient denies other neurologic symptoms, and neurologic exam (including sensation) is normla at this time.  Patient with hx of htn, however no other CVA risk factors and given transient symptoms without other neurologic symptoms, have low suspicion symptoms represent CVA/TIA/carotid/vertebral dissection.  No CP/SOB and screening ecg without acute changes.  Given pt does report HA, screening head CT done for large masses/abnormalities completed showing no acute findings.  Patient sleeping on that side and symptoms may be secondary to peripheral nerve compression, however given recent neck pain, other disc herniation or cervical pathology is in differential. No trauma and doubt cervical spine fx.  No symptoms of cord compression and normal neurologic exam at this time. Recommend close follow up with PCP. Patient discharged in stable condition with understanding of reasons to return.     Gareth Morgan, MD 02/13/15 [phone]

## 2015-02-13 NOTE — Telephone Encounter (Signed)
Patients wife states that he is out of medication as of today

## 2015-02-13 NOTE — ED Notes (Signed)
Patient transported to CT 

## 2015-05-06 ENCOUNTER — Emergency Department (HOSPITAL_BASED_OUTPATIENT_CLINIC_OR_DEPARTMENT_OTHER)
Admission: EM | Admit: 2015-05-06 | Discharge: 2015-05-07 | Disposition: A | Discharge status: Home / Self Care | Payer: 59 | Attending: Physician Assistant | Admitting: Physician Assistant

## 2015-05-06 ENCOUNTER — Encounter (HOSPITAL_BASED_OUTPATIENT_CLINIC_OR_DEPARTMENT_OTHER): Payer: Self-pay | Admitting: Emergency Medicine

## 2015-05-06 ENCOUNTER — Emergency Department (HOSPITAL_BASED_OUTPATIENT_CLINIC_OR_DEPARTMENT_OTHER): Payer: 59

## 2015-05-06 DIAGNOSIS — Z8719 Personal history of other diseases of the digestive system: Secondary | ICD-10-CM | POA: Insufficient documentation

## 2015-05-06 DIAGNOSIS — R1013 Epigastric pain: Secondary | ICD-10-CM | POA: Diagnosis not present

## 2015-05-06 DIAGNOSIS — I10 Essential (primary) hypertension: Secondary | ICD-10-CM | POA: Diagnosis not present

## 2015-05-06 DIAGNOSIS — R1033 Periumbilical pain: Secondary | ICD-10-CM | POA: Diagnosis not present

## 2015-05-06 DIAGNOSIS — R109 Unspecified abdominal pain: Secondary | ICD-10-CM | POA: Diagnosis present

## 2015-05-06 DIAGNOSIS — M545 Low back pain: Secondary | ICD-10-CM | POA: Diagnosis not present

## 2015-05-06 LAB — URINALYSIS, ROUTINE W REFLEX MICROSCOPIC
Bilirubin Urine: NEGATIVE
GLUCOSE, UA: NEGATIVE mg/dL
HGB URINE DIPSTICK: NEGATIVE
Ketones, ur: NEGATIVE mg/dL
Leukocytes, UA: NEGATIVE
Nitrite: NEGATIVE
Protein, ur: NEGATIVE mg/dL
SPECIFIC GRAVITY, URINE: 1.023 (ref 1.005–1.030)
pH: 6 (ref 5.0–8.0)

## 2015-05-06 LAB — CBC WITH DIFFERENTIAL/PLATELET
Basophils Absolute: 0 10*3/uL (ref 0.0–0.1)
Basophils Relative: 0 %
EOS PCT: 1 %
Eosinophils Absolute: 0.1 10*3/uL (ref 0.0–0.7)
HEMATOCRIT: 44.8 % (ref 39.0–52.0)
Hemoglobin: 15.7 g/dL (ref 13.0–17.0)
LYMPHS ABS: 2.6 10*3/uL (ref 0.7–4.0)
LYMPHS PCT: 36 %
MCH: 31.3 pg (ref 26.0–34.0)
MCHC: 35 g/dL (ref 30.0–36.0)
MCV: 89.2 fL (ref 78.0–100.0)
MONO ABS: 0.6 10*3/uL (ref 0.1–1.0)
Monocytes Relative: 9 %
NEUTROS ABS: 3.9 10*3/uL (ref 1.7–7.7)
Neutrophils Relative %: 54 %
PLATELETS: 192 10*3/uL (ref 150–400)
RBC: 5.02 MIL/uL (ref 4.22–5.81)
RDW: 13.1 % (ref 11.5–15.5)
WBC: 7.2 10*3/uL (ref 4.0–10.5)

## 2015-05-06 LAB — BASIC METABOLIC PANEL
Anion gap: 7 (ref 5–15)
BUN: 19 mg/dL (ref 6–20)
CHLORIDE: 105 mmol/L (ref 101–111)
CO2: 25 mmol/L (ref 22–32)
Calcium: 9.5 mg/dL (ref 8.9–10.3)
Creatinine, Ser: 0.94 mg/dL (ref 0.61–1.24)
GFR calc Af Amer: >60 mL/min (ref >60)
GLUCOSE: 115 mg/dL — AB (ref 65–99)
POTASSIUM: 3.7 mmol/L (ref 3.5–5.1)
Sodium: 137 mmol/L (ref 135–145)

## 2015-05-06 LAB — LIPASE, BLOOD: Lipase: 24 U/L (ref 11–51)

## 2015-05-06 NOTE — ED Notes (Signed)
Pt c/o generalized abd pain x 1 week and lower back pain x 2 days  Left lower back pain greater than rt,  Denies urinary sx

## 2015-05-06 NOTE — ED Notes (Signed)
Abdominal pain x 1 week, pain progressed to back this week, was told a couple of months ago he had kidney stone, denies urinary issues

## 2015-05-06 NOTE — ED Provider Notes (Signed)
CSN: [id number]     Arrival date & time 05/06/15  1655 History  By signing my name below, I, Arianna Nassar, attest that this documentation has been prepared under the direction and in the presence of Dyan Labarbera Randall AnLyn Jamille Yoshino, MD. Electronically Signed: Octavia HeirArianna Nassar, ED Scribe. 05/06/2015. 9:27 PM.    Chief Complaint  Patient presents with  . Abdominal Pain     The history is provided by the patient. No language interpreter was used.   HPI Comments: Joel Maldonado is a 40 y.o. male who has a hx of diverticulitis presents to the Emergency Department complaining of intermittent, gradual worsening, moderate, left sided abdominal pain onset one week ago with associated left lower back pain that started two days ago. Pt reports he does a lot of heavy moving at his job and he is unsure if he pulled a muscle before. He denies hematuria, dysuria, nausea, vomiting, and any other symptoms.  Past Medical History  Diagnosis Date  . Hypertension    History reviewed. No pertinent past surgical history. Family History  Problem Relation Age of Onset  . Cancer Mother   . COPD Father    Social History  Substance Use Topics  . Smoking status: Never Smoker   . Smokeless tobacco: Current User  . Alcohol Use: Yes     Comment: occasionally    Review of Systems  Gastrointestinal: Positive for abdominal pain. Negative for nausea and vomiting.  Genitourinary: Negative for dysuria and hematuria.  Musculoskeletal: Positive for back pain.  All other systems reviewed and are negative.     Allergies  Review of patient's allergies indicates no known allergies.  Home Medications   Prior to Admission medications   Medication Sig Start Date End Date Taking? Authorizing Provider  Aspirin-Acetaminophen-Caffeine (GOODY HEADACHE PO) Take 1 packet by mouth daily as needed (pain / headache).   Yes Historical Provider, MD  losartan (COZAAR) 25 MG tablet One po q am 02/13/15  Yes Margaree MackintoshMary J Baxley, MD  omeprazole  (PRILOSEC) 20 MG capsule Take 1 capsule (20 mg total) by mouth daily. Patient taking differently: Take 20 mg by mouth daily as needed (heartburn).  01/06/14  Yes Shon Batonourtney F Horton, MD  glucose blood test strip Check blood sugar before breakfast and before supper 10/08/14   Margaree MackintoshMary J Baxley, MD   Triage vitals: BP 142/101 mmHg  Pulse 76  Temp(Src) 98.2 F (36.8 C) (Oral)  Resp 20  Ht 5\' 8"  (1.727 m)  Wt 270 lb (122.471 kg)  BMI 41.06 kg/m2  SpO2 100% Physical Exam  Constitutional: He is oriented to person, place, and time. He appears well-developed and well-nourished.  HENT:  Head: Normocephalic and atraumatic.  Mouth/Throat: Oropharynx is clear and moist.  Eyes: Conjunctivae and EOM are normal. Pupils are equal, round, and reactive to light.  Neck: Normal range of motion.  Cardiovascular: Normal rate, regular rhythm and normal heart sounds.   Pulmonary/Chest: Effort normal and breath sounds normal.  Abdominal: Soft. Bowel sounds are normal. There is tenderness.  Left mid to left periumbilical tenderness  Musculoskeletal: Normal range of motion.  Neurological: He is alert and oriented to person, place, and time.  Skin: Skin is warm and dry.  Psychiatric: He has a normal mood and affect.  Nursing note and vitals reviewed.   ED Course  Procedures  DIAGNOSTIC STUDIES: Oxygen Saturation is 100% on RA, normal by my interpretation.  COORDINATION OF CARE:  9:15 PM Will order CT of abdomen. Discussed treatment plan which includes  lab work with pt at bedside and pt agreed to plan.  Labs Review Labs Reviewed  BASIC METABOLIC PANEL - Abnormal; Notable for the following:    Glucose, Bld 115 (*)    All other components within normal limits  URINALYSIS, ROUTINE W REFLEX MICROSCOPIC (NOT AT [facility])  CBC WITH DIFFERENTIAL/PLATELET  LIPASE, BLOOD    Imaging Review No results found. I have personally reviewed and evaluated these images and lab results as part of my medical  decision-making.   EKG Interpretation None      MDM   Final diagnoses:  None    Patient is a 40 year old male with history of diverticulitis presenting with abdominal pain periumbilical. Patient's been eating drinking normally. No nausea vomiting or diarrhea. Patient does have mild tenderness on exam. We'll get CT abdomen.  Patient has normal vital signs, normal physical exam except for mild tenderness. If CT is normal discharge home with epigastric pain of unknown origin and omeprazole.  Patient has been tolerating by mouth at home.  I personally performed the services described in this documentation, which was scribed in my presence. The recorded information has been reviewed and is accurate.     Charde Macfarlane Randall An, MD 05/07/15 0003

## 2015-05-07 MED ORDER — OMEPRAZOLE 20 MG PO CPDR: 20.0000 mg | DELAYED_RELEASE_CAPSULE | Freq: Every day | ORAL | Status: AC

## 2015-05-07 MED ORDER — IOHEXOL 300 MG/ML  SOLN
50.0000 mL | Freq: Once | INTRAMUSCULAR | Status: AC | PRN
  Administered 2015-05-07: 50 mL via ORAL

## 2015-05-07 MED ORDER — IOHEXOL 300 MG/ML  SOLN
100.0000 mL | Freq: Once | INTRAMUSCULAR | Status: AC | PRN
  Administered 2015-05-07: 100 mL via INTRAVENOUS

## 2015-05-07 NOTE — ED Provider Notes (Signed)
Patient received in signout from Dr. Corlis LeakMackuen at shift change.  Please see her note for full H&P. Briefly, 40 year old male here with periumbilical abdominal pain. History of diverticulitis. Has been eating and drinking normally. Patient was found to have some tenderness on exam. Labs thus far reassuring.  Plan: CT pending to rule out diverticulitis given patient's history. If negative may be discharged home.  Results for orders placed or performed during the hospital encounter of 05/06/15  Urinalysis, Routine w reflex microscopic (not [facility])  Result Value Ref Range   Color, Urine YELLOW YELLOW   APPearance CLEAR CLEAR   Specific Gravity, Urine 1.023 1.005 - 1.030   pH 6.0 5.0 - 8.0   Glucose, UA NEGATIVE NEGATIVE mg/dL   Hgb urine dipstick NEGATIVE NEGATIVE   Bilirubin Urine NEGATIVE NEGATIVE   Ketones, ur NEGATIVE NEGATIVE mg/dL   Protein, ur NEGATIVE NEGATIVE mg/dL   Nitrite NEGATIVE NEGATIVE   Leukocytes, UA NEGATIVE NEGATIVE  CBC with Differential  Result Value Ref Range   WBC 7.2 4.0 - 10.5 K/uL   RBC 5.02 4.22 - 5.81 MIL/uL   Hemoglobin 15.7 13.0 - 17.0 g/dL   HCT 16.144.8 09.639.0 - 04.552.0 %   MCV 89.2 78.0 - 100.0 fL   MCH 31.3 26.0 - 34.0 pg   MCHC 35.0 30.0 - 36.0 g/dL   RDW 40.913.1 81.111.5 - 91.415.5 %   Platelets 192 150 - 400 K/uL   Neutrophils Relative % 54 %   Neutro Abs 3.9 1.7 - 7.7 K/uL   Lymphocytes Relative 36 %   Lymphs Abs 2.6 0.7 - 4.0 K/uL   Monocytes Relative 9 %   Monocytes Absolute 0.6 0.1 - 1.0 K/uL   Eosinophils Relative 1 %   Eosinophils Absolute 0.1 0.0 - 0.7 K/uL   Basophils Relative 0 %   Basophils Absolute 0.0 0.0 - 0.1 K/uL  Basic metabolic panel  Result Value Ref Range   Sodium 137 135 - 145 mmol/L   Potassium 3.7 3.5 - 5.1 mmol/L   Chloride 105 101 - 111 mmol/L   CO2 25 22 - 32 mmol/L   Glucose, Bld 115 (H) 65 - 99 mg/dL   BUN 19 6 - 20 mg/dL   Creatinine, Ser 7.820.94 0.61 - 1.24 mg/dL   Calcium 9.5 8.9 - 95.610.3 mg/dL   GFR calc non Af Amer >60 >60  mL/min   GFR calc Af Amer >60 >60 mL/min   Anion gap 7 5 - 15  Lipase, blood  Result Value Ref Range   Lipase 24 11 - 51 U/L   Ct Abdomen Pelvis W Contrast  05/07/2015  CLINICAL DATA:  Left lower quadrant pain for 1 week. Low back pain. Previous history of diverticulitis. EXAM: CT ABDOMEN AND PELVIS WITH CONTRAST TECHNIQUE: Multidetector CT imaging of the abdomen and pelvis was performed using the standard protocol following bolus administration of intravenous contrast. CONTRAST:  50mL OMNIPAQUE IOHEXOL 300 MG/ML SOLN, 100mL OMNIPAQUE IOHEXOL 300 MG/ML SOLN COMPARISON:  12/30/2014 FINDINGS: Subpleural nodules in the right lung base, largest measuring 5 mm diameter. No change since previous study. No change dating back to 02/28/2007, suggesting benign etiology. The liver, spleen, gallbladder, pancreas, adrenal glands, abdominal aorta, inferior vena cava, and retroperitoneal lymph nodes are unremarkable. Stone in the midportion left kidney. No hydronephrosis or solid mass in either kidney. Stomach, small bowel, and colon appear normal. No free air or free fluid in the abdomen. Pelvis: Diverticulosis of the sigmoid colon. No inflammatory changes to suggest diverticulitis.  No free or loculated pelvic fluid collections. No pelvic mass or lymphadenopathy. Prostate gland is not enlarged. Bladder wall is not thickened. Appendix is normal. No destructive bone lesions. IMPRESSION: No acute process demonstrated in the abdomen or pelvis. No evidence of bowel obstruction or inflammation. Electronically Signed   By: Burman Nieves M.D.   On: 05/07/2015 00:23   CT scan negative for acute findings. Patient will be discharged home with prescriptions and instructions per Dr. Corlis Leak.  Encouraged PCP follow-up.  Discussed plan with patient, he/she acknowledged understanding and agreed with plan of care.  Return precautions given for new or worsening symptoms.  Garlon Hatchet, PA-C 05/07/15 0052  Courteney Randall An,  MD 05/09/15 [phone]

## 2015-05-07 NOTE — Discharge Instructions (Signed)
We're unsure what caused your pain. However your labs and CT are all normal.

## 2015-06-27 ENCOUNTER — Other Ambulatory Visit: Payer: Self-pay | Admitting: Internal Medicine

## 2015-08-15 ENCOUNTER — Other Ambulatory Visit: Payer: 59 | Admitting: Internal Medicine

## 2015-08-21 ENCOUNTER — Other Ambulatory Visit: Payer: 59 | Admitting: Internal Medicine

## 2015-08-25 ENCOUNTER — Other Ambulatory Visit: Payer: 59 | Admitting: Internal Medicine

## 2015-08-25 ENCOUNTER — Encounter: Payer: 59 | Admitting: Internal Medicine

## 2015-08-29 ENCOUNTER — Encounter: Payer: 59 | Admitting: Internal Medicine

## 2015-10-21 ENCOUNTER — Other Ambulatory Visit: Payer: Self-pay | Admitting: Internal Medicine

## 2015-10-22 ENCOUNTER — Other Ambulatory Visit: Payer: Self-pay | Admitting: Internal Medicine

## 2015-10-31 ENCOUNTER — Encounter: Payer: Self-pay | Admitting: Internal Medicine

## 2015-10-31 ENCOUNTER — Ambulatory Visit (INDEPENDENT_AMBULATORY_CARE_PROVIDER_SITE_OTHER): Payer: 59 | Admitting: Internal Medicine

## 2015-10-31 VITALS — BP 148/112 | HR 92 | Ht 68.0 in | Wt 266.0 lb

## 2015-10-31 DIAGNOSIS — E785 Hyperlipidemia, unspecified: Secondary | ICD-10-CM | POA: Diagnosis not present

## 2015-10-31 DIAGNOSIS — E8881 Metabolic syndrome: Secondary | ICD-10-CM

## 2015-10-31 DIAGNOSIS — E119 Type 2 diabetes mellitus without complications: Secondary | ICD-10-CM | POA: Diagnosis not present

## 2015-10-31 DIAGNOSIS — E669 Obesity, unspecified: Secondary | ICD-10-CM

## 2015-10-31 DIAGNOSIS — I10 Essential (primary) hypertension: Secondary | ICD-10-CM | POA: Diagnosis not present

## 2015-10-31 LAB — LIPID PANEL
CHOL/HDL RATIO: 3.9 ratio (ref <=5.0)
CHOLESTEROL: 224 mg/dL — AB (ref 125–200)
HDL: 57 mg/dL (ref >=40)
LDL Cholesterol: 139 mg/dL — ABNORMAL HIGH (ref <130)
Triglycerides: 139 mg/dL (ref <150)
VLDL: 28 mg/dL (ref <30)

## 2015-10-31 MED ORDER — LOSARTAN POTASSIUM 25 MG PO TABS: 25.0000 mg | ORAL_TABLET | Freq: Every morning | ORAL | 2 refills | Status: AC

## 2015-10-31 NOTE — Patient Instructions (Signed)
Restart BP med. Labs pending. Recheck BP here in 3 weeks.

## 2015-11-04 LAB — GLYCOHEMOGLOBIN, TOTAL: GLYCOHEMOGLOBIN (GHB), TOTAL: 7.7 % — AB (ref 5.4–7.4)

## 2015-11-14 DIAGNOSIS — E8881 Metabolic syndrome: Secondary | ICD-10-CM | POA: Insufficient documentation

## 2015-11-14 DIAGNOSIS — I10 Essential (primary) hypertension: Secondary | ICD-10-CM | POA: Insufficient documentation

## 2015-11-14 NOTE — Progress Notes (Signed)
   Subjective:    Patient ID: Joel Maldonado, male    DOB: 06-Feb-1976, 40 y.o.   MRN: [id number]  HPI Patient is a 40 year old male who presented to the office for the first time in July 2016. He was found to have diabetes mellitus and hypertension. He is overweight. History of hyperlipidemia. Year ago total cholesterol was 245, triglycerides 166 and LDL cholesterol 163. Lipid panel drawn September 15 shows total cholesterol 224 with an LDL cholesterol of 139. Triglycerides are now normal. Glycohemoglobin 7.7%. Blood pressure is elevated 148/112. He weighs 266 pounds.  He and his wife have separated. [place] has a [place] offer in [place] and will be moving in the near future. Admits he is probably not been taking good care of himself recently. Has had some situational stress.    Review of Systems as above     Objective:   Physical Exam Skin warm and dry. Nodes none. Neck is supple without JVD thyromegaly or carotid bruits. Chest clear to auscultation. Cardiac exam regular rate and rhythm normal S1 and S2. Extremities without edema.       Assessment & Plan:  Essential hypertension has not been taking losartan. Restart 25 mg daily.  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  Plan: He is to return in 3 weeks to follow-up on blood pressure. He likely will need lipid lowering medication. He is to watch his carbohydrate intake. We'll discuss diabetic management further with him when he returns.

## 2015-11-20 ENCOUNTER — Ambulatory Visit: Payer: 59 | Admitting: Internal Medicine

## 2015-11-21 ENCOUNTER — Ambulatory Visit: Payer: 59 | Admitting: Internal Medicine

## 2016-03-01 ENCOUNTER — Telehealth: Payer: Self-pay

## 2016-03-01 ENCOUNTER — Other Ambulatory Visit: Payer: Self-pay | Admitting: Internal Medicine

## 2016-03-01 NOTE — Telephone Encounter (Signed)
He was last seen in Sept and was to follow up in 3 weeks  in [place] since he was moving or else here. Cannot refill unless books appt.

## 2016-03-02 NOTE — Telephone Encounter (Signed)
Both cell and home numbers are disconnected.

## 2016-05-27 IMAGING — CT CT HEAD W/O CM
2 series · 16 of 30 positions shown, 20 images · non-contrast
Comparison: None.

CLINICAL DATA: 6yom, Has had a HA x 3 days. Pt has a Hx of HTN.
Has had some CP

EXAM:
CT HEAD WITHOUT CONTRAST
TECHNIQUE: Contiguous axial images were obtained from the base of the skull
through the vertex without intravenous contrast.

[Series 201: head w/o, idose (1) · axial · non-contrast · 0.49mm/px · z∈[+79,+214]mm · 13 of 33 slices shown, 17 images]
[im 3/33  brain]
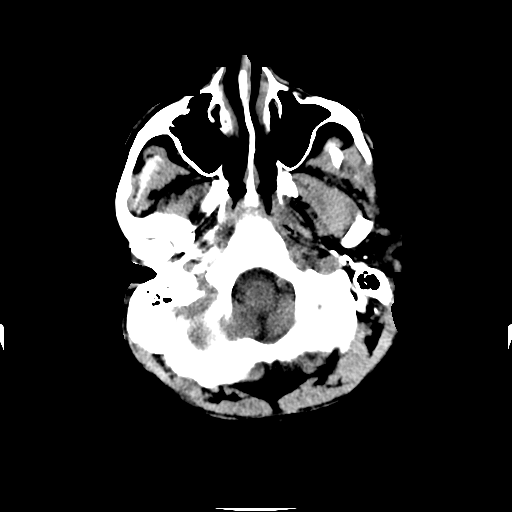
[im 3/33  bone]
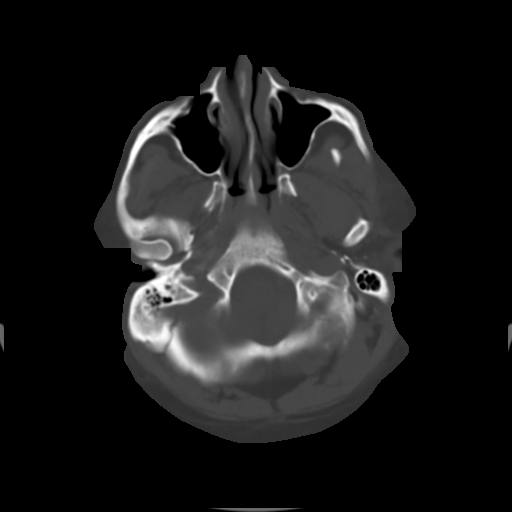
[im 5/33  brain]
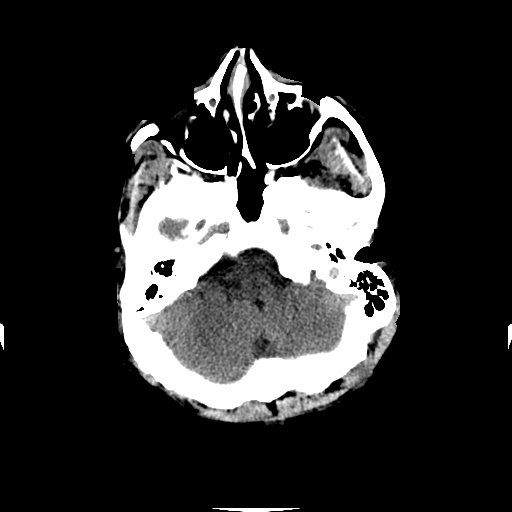
[im 7/33  brain]
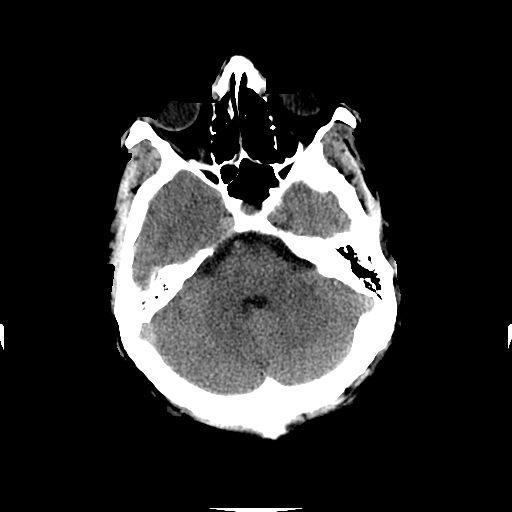
[im 10/33  brain]
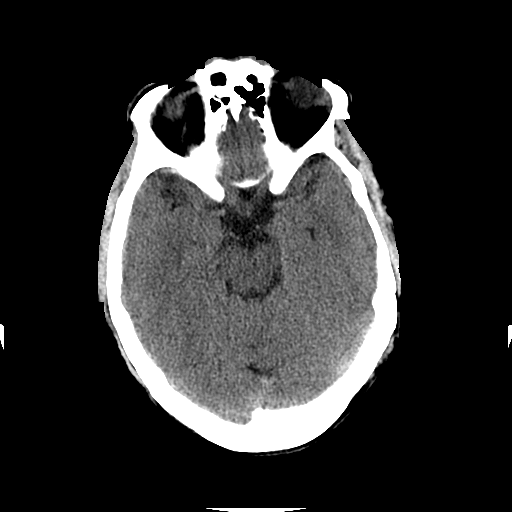
[im 12/33  brain]
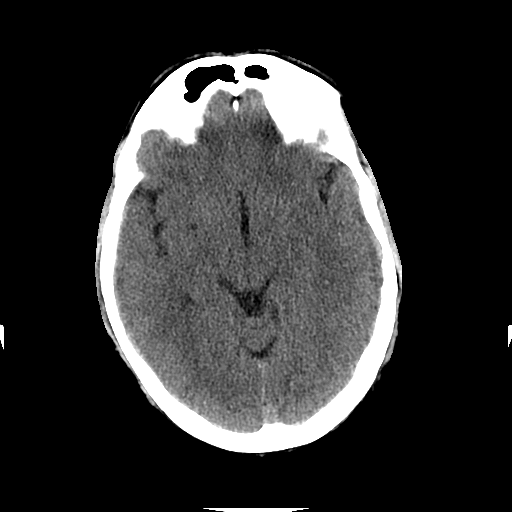
[im 12/33  bone]
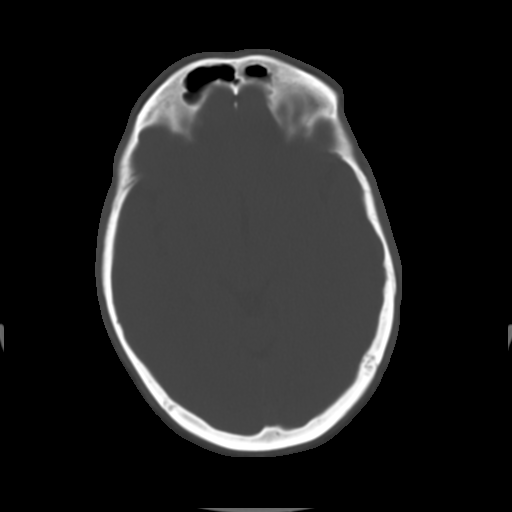
[im 14/33  brain]
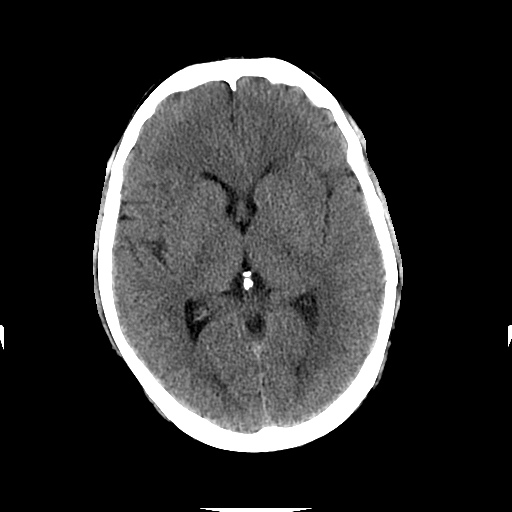
[im 17/33  brain]
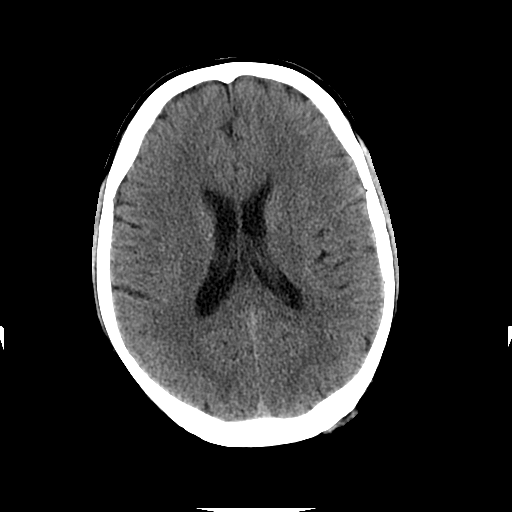
[im 19/33  brain]
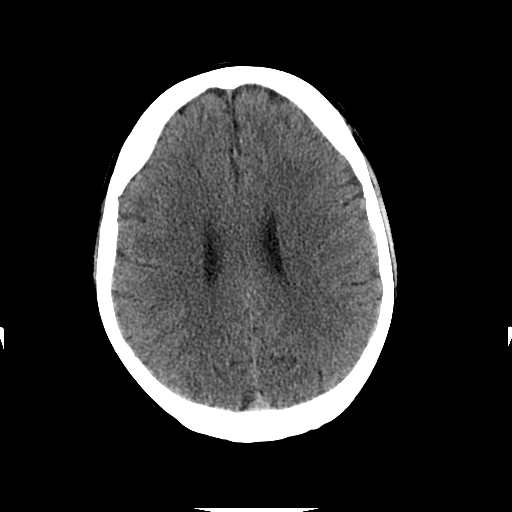
[im 21/33  brain]
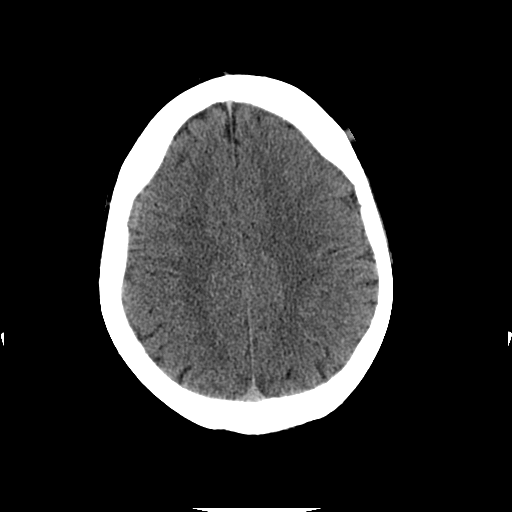
[im 21/33  bone]
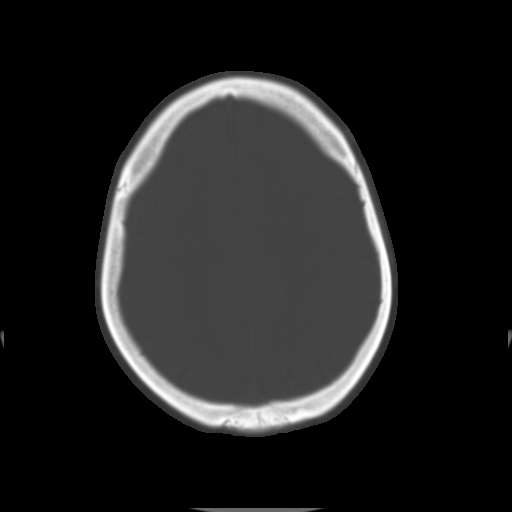
[im 23/33  brain]
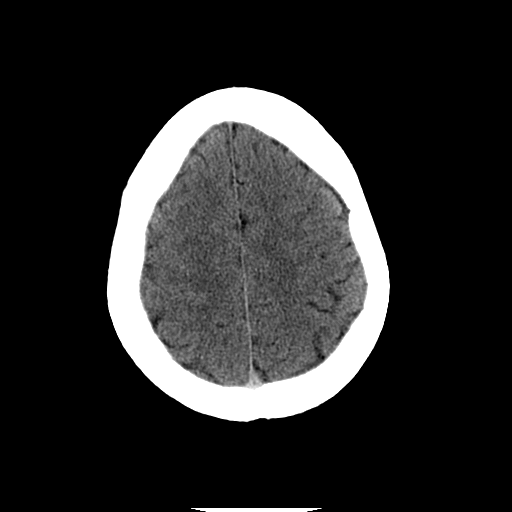
[im 26/33  brain]
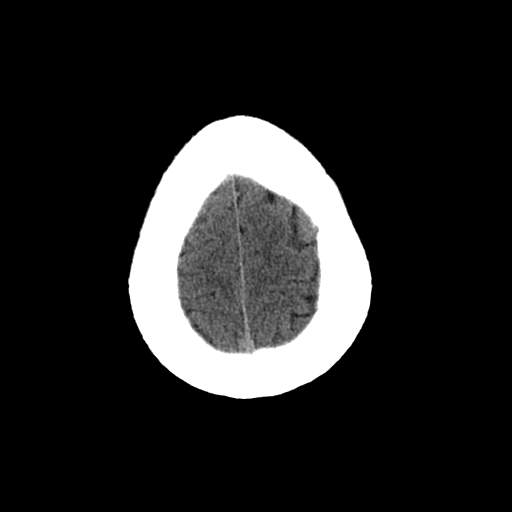
[im 28/33  brain]
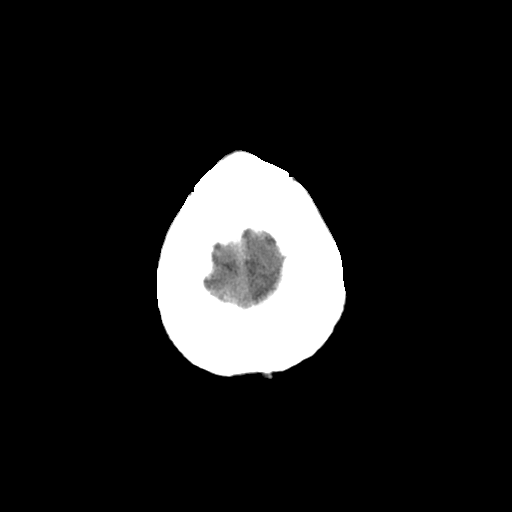
[im 30/33  brain]
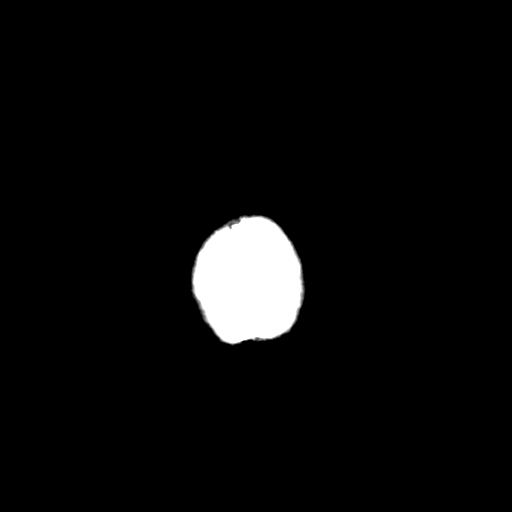
[im 30/33  bone]
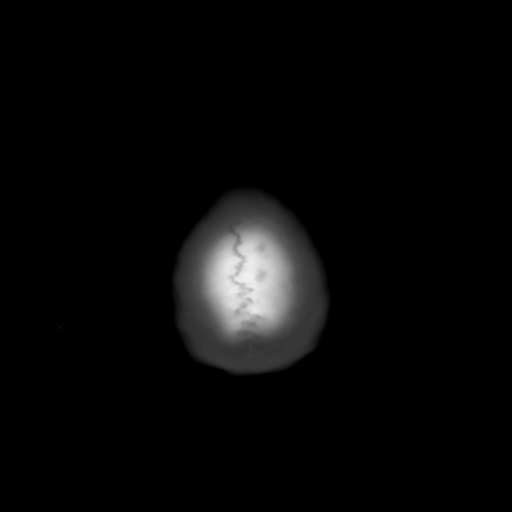

[Series 202: head w/o bone, idose (1) · axial · non-contrast · 0.49mm/px · z∈[+79,+124]mm · 3 of 33 slices shown]
[im 3/33  bone]
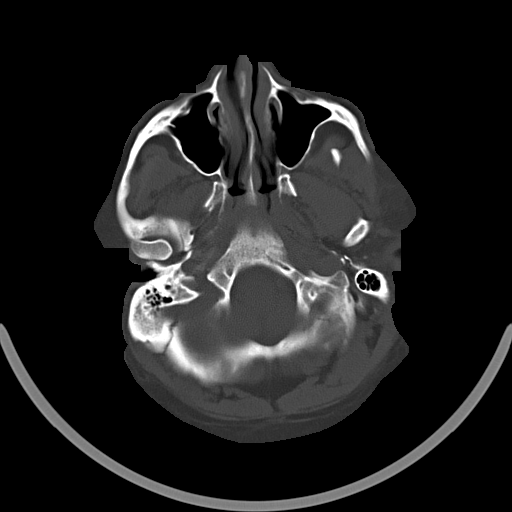
[im 7/33  bone]
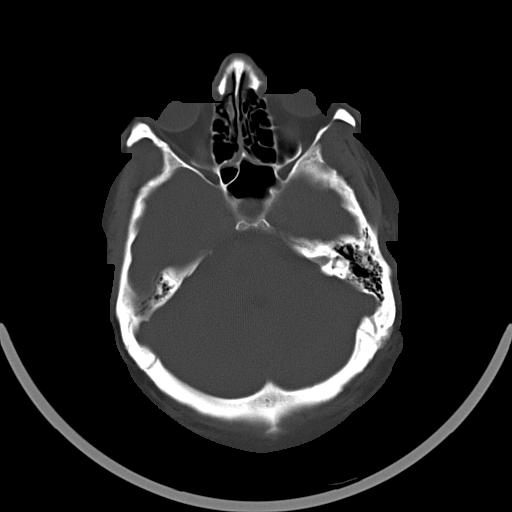
[im 12/33  bone]
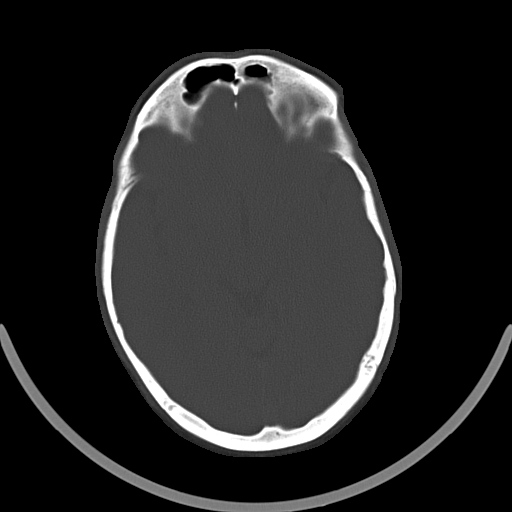

[16 of 30 positions shown; findings below may reference images not displayed]

FINDINGS: Ventricles are normal in size and configuration. There are no
parenchymal masses or mass effect. There is no evidence of an
infarct. No extra-axial masses or abnormal fluid collections.

There is no intracranial hemorrhage.

There is mild right ethmoid and right inferior frontal sinus mucosal
thickening. Mastoid air cells are clear. No skull lesion.
IMPRESSION: 1. No intracranial abnormality.
2. Mild sinus mucosal thickening.

## 2016-07-01 NOTE — Telephone Encounter (Signed)
Error

## 2016-12-14 IMAGING — CT CT RENAL STONE PROTOCOL
2 of 4 series · 16 of 46 positions shown, 18 images · non-contrast
Comparison: 08/27/2012

CLINICAL DATA: Right flank pain starting this morning

EXAM:
CT ABDOMEN AND PELVIS WITHOUT CONTRAST
TECHNIQUE: Multidetector CT imaging of the abdomen and pelvis was performed
following the standard protocol without IV contrast.

[Series 2: renal stone > 200 lbs 5.0 b31f · axial · 0.97mm/px · z∈[-509,+41]mm · 13 of 121 slices shown, 15 images]
[im 6/121  soft-tissue]
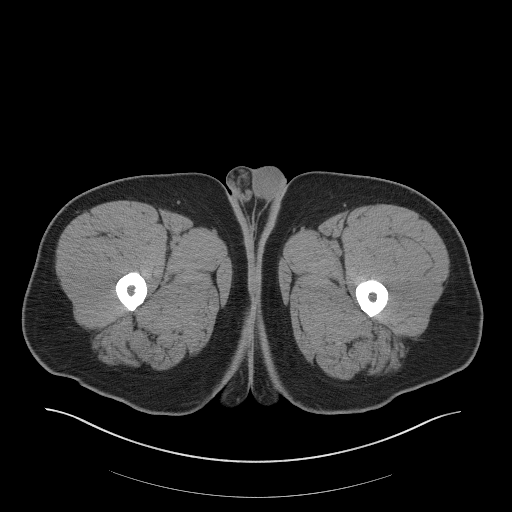
[im 6/121  bone]
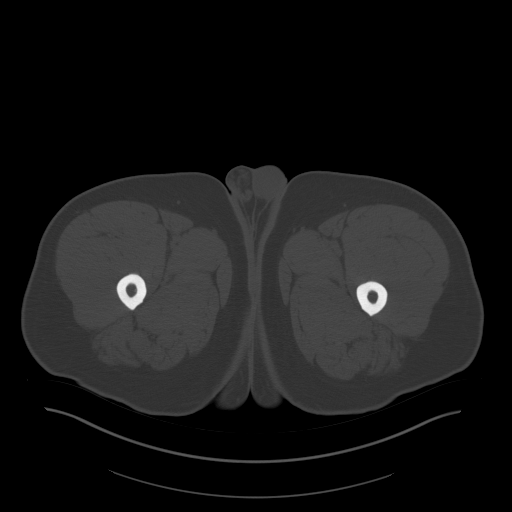
[im 16/121  soft-tissue]
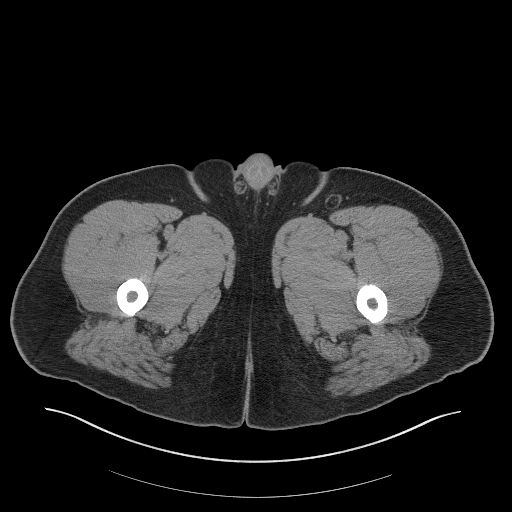
[im 26/121  soft-tissue]
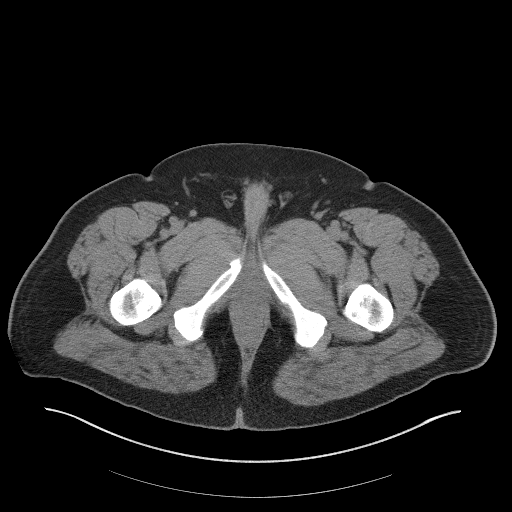
[im 36/121  soft-tissue]
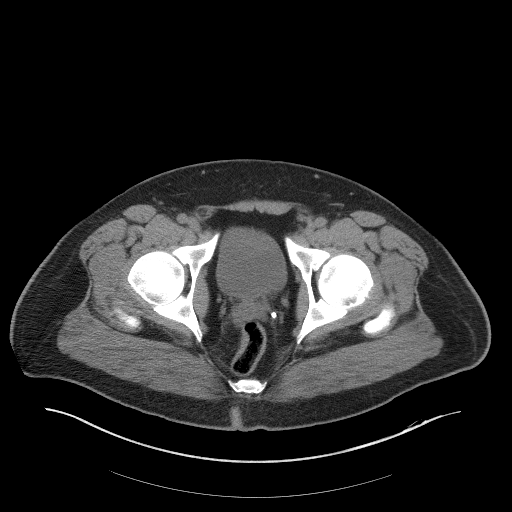
[im 41/121  soft-tissue]
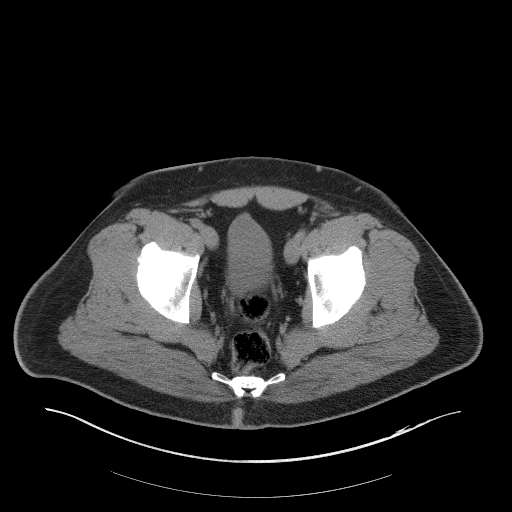
[im 51/121  soft-tissue]
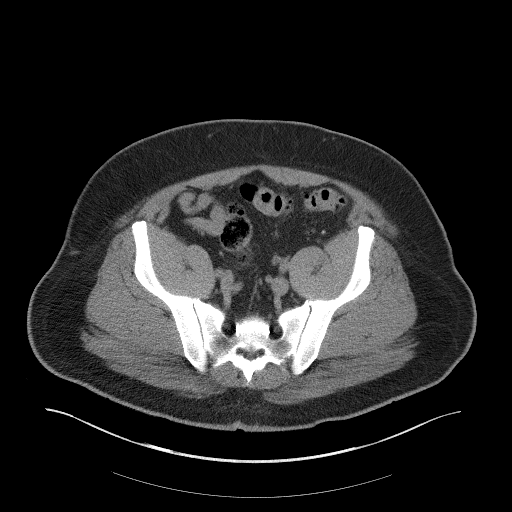
[im 61/121  soft-tissue]
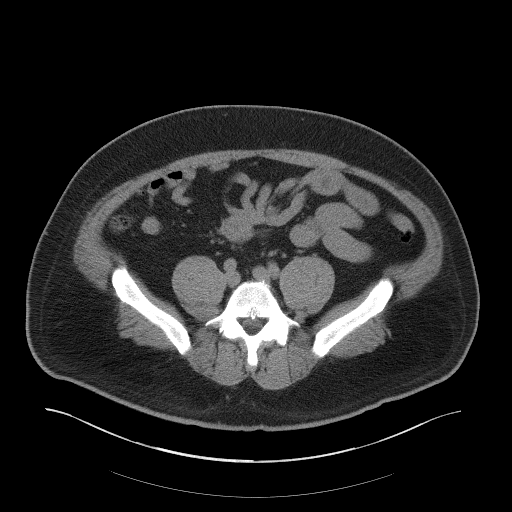
[im 71/121  soft-tissue]
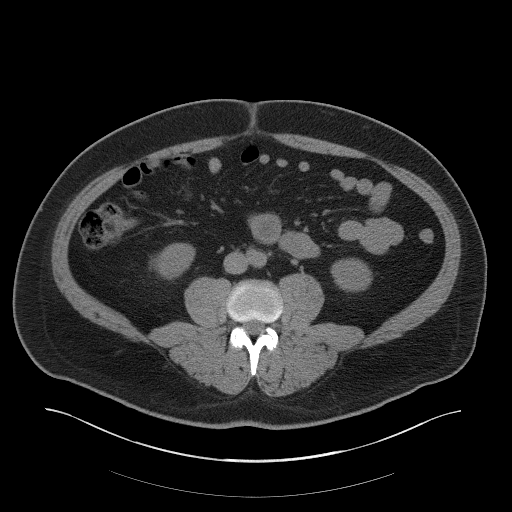
[im 81/121  soft-tissue]
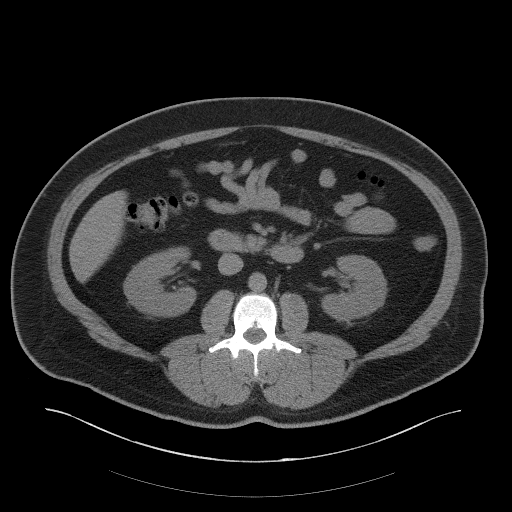
[im 81/121  bone]
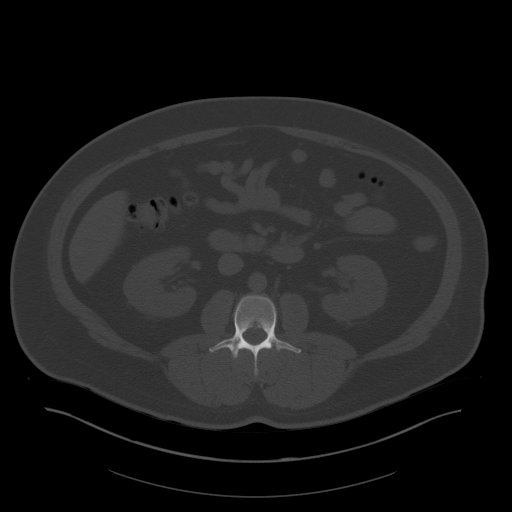
[im 86/121  soft-tissue]
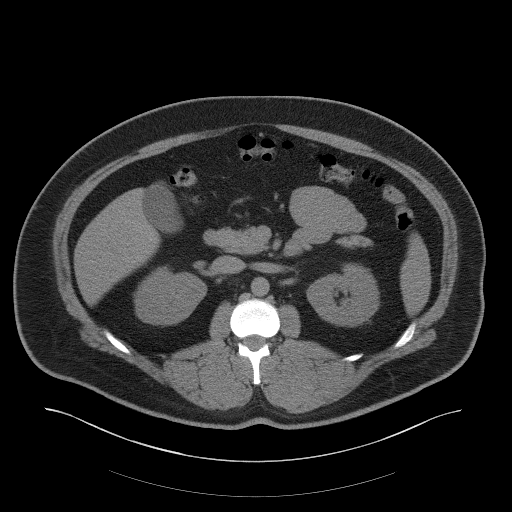
[im 96/121  soft-tissue]
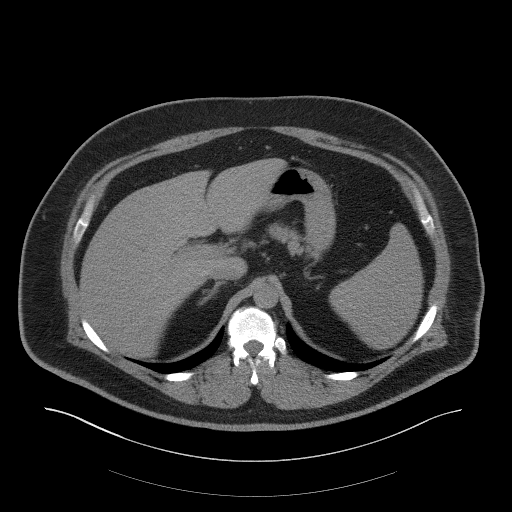
[im 106/121  soft-tissue]
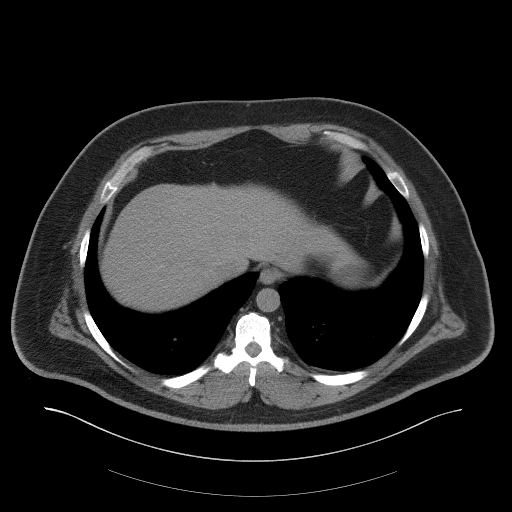
[im 116/121  soft-tissue]
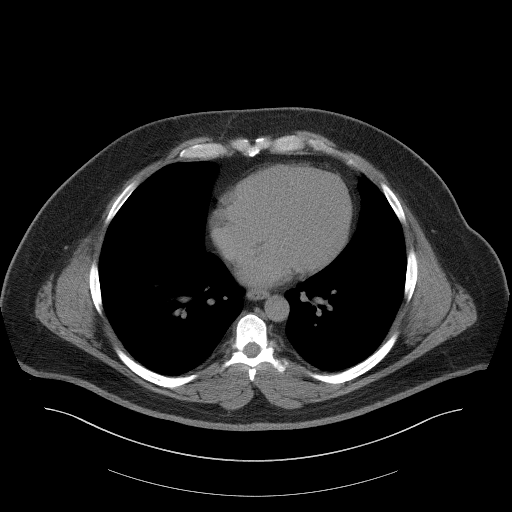

[Series 5: renal stone 3.0 coronal · coronal · 1.05mm/px · 3 of 109 slices shown]
[im 37/109  soft-tissue]
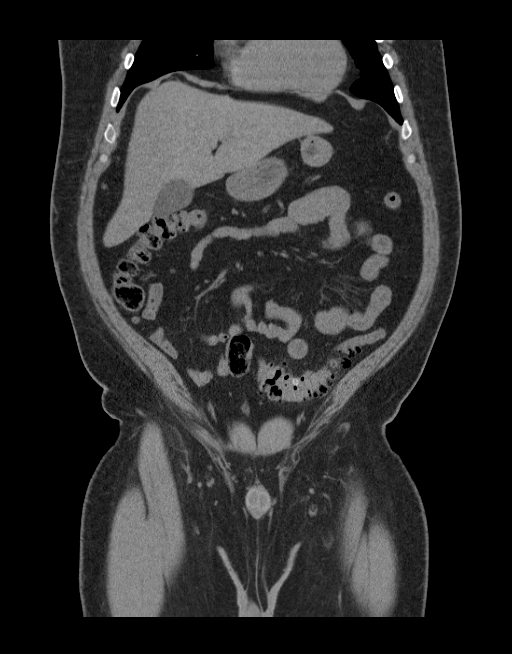
[im 49/109  soft-tissue]
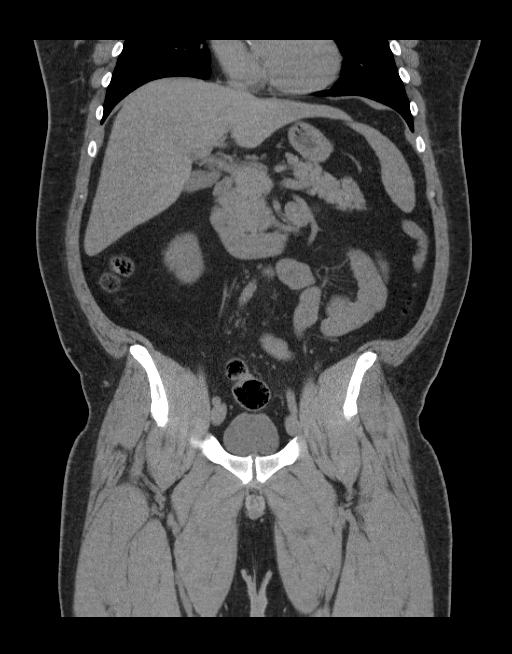
[im 61/109  soft-tissue]
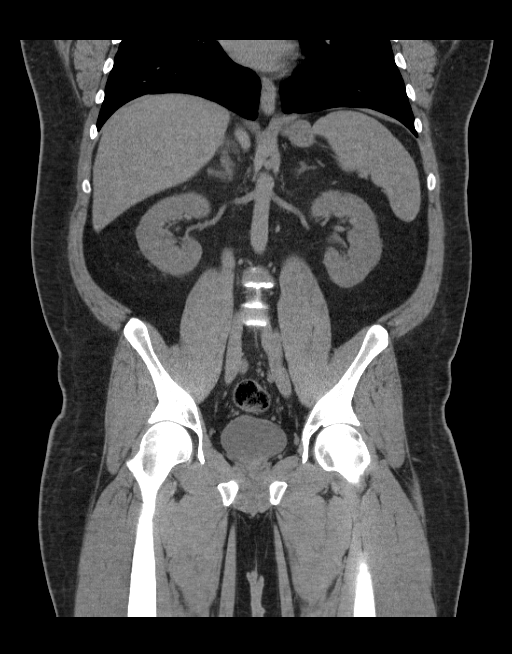

[16 of 46 positions shown; findings below may reference images not displayed]

FINDINGS: Lung bases are unremarkable. Sagittal images of the spine shows mild
degenerative changes lower thoracic and lumbar spine.

No aortic aneurysm. Unenhanced liver, spleen, pancreas and adrenal
glands are unremarkable. No calcified gallstones are noted within
gallbladder. Unenhanced kidneys are symmetrical in size. No
hydronephrosis or hydroureter. There is 3 mm calcified
nonobstructive calculus in midpole of the left kidney. No calcified
ureteral calculi. No small bowel obstruction. No ascites or free
air. No adenopathy. Few diverticula are noted descending colon.
Multiple sigmoid colon diverticula. No evidence of acute
diverticulitis. Prostate gland and seminal vesicles are
unremarkable.

There is no pericecal inflammation. The terminal ileum is
unremarkable. Normal appendix is noted in axial image 60. No
calcified calculi are noted within urinary bladder. The
IMPRESSION: 1. There is left nonobstructive nephrolithiasis. No hydronephrosis
or hydroureter.
2. No calcified ureteral calculi.
3. No pericecal inflammation.  Normal appendix.
4. Distal colonic diverticulosis. No evidence of acute
diverticulitis.
5. No small bowel obstruction.
